# Patient Record
Sex: Female | Born: 1950 | Race: White | Hispanic: No | Marital: Married | Smoking: Never smoker
Health system: Southern US, Community
[De-identification: ages and names within clinical notes are randomized; demographics above are authoritative.]

## PROBLEM LIST (undated history)

## (undated) DIAGNOSIS — I209 Angina pectoris, unspecified: Secondary | ICD-10-CM

## (undated) DIAGNOSIS — R51 Headache: Secondary | ICD-10-CM

## (undated) DIAGNOSIS — I499 Cardiac arrhythmia, unspecified: Secondary | ICD-10-CM

## (undated) DIAGNOSIS — M797 Fibromyalgia: Secondary | ICD-10-CM

## (undated) DIAGNOSIS — I739 Peripheral vascular disease, unspecified: Secondary | ICD-10-CM

## (undated) DIAGNOSIS — D649 Anemia, unspecified: Secondary | ICD-10-CM

## (undated) DIAGNOSIS — M1990 Unspecified osteoarthritis, unspecified site: Secondary | ICD-10-CM

## (undated) DIAGNOSIS — F32A Depression, unspecified: Secondary | ICD-10-CM

## (undated) DIAGNOSIS — I10 Essential (primary) hypertension: Secondary | ICD-10-CM

## (undated) DIAGNOSIS — F329 Major depressive disorder, single episode, unspecified: Secondary | ICD-10-CM

## (undated) DIAGNOSIS — F419 Anxiety disorder, unspecified: Secondary | ICD-10-CM

## (undated) HISTORY — PX: JOINT REPLACEMENT: SHX530

## (undated) HISTORY — PX: VEIN LIGATION AND STRIPPING: SHX2653

## (undated) HISTORY — PX: APPENDECTOMY: SHX54

## (undated) HISTORY — PX: CARPAL TUNNEL RELEASE: SHX101

---

## 1997-09-21 ENCOUNTER — Ambulatory Visit (HOSPITAL_BASED_OUTPATIENT_CLINIC_OR_DEPARTMENT_OTHER): Admission: RE | Admit: 1997-09-21 | Discharge: 1997-09-21 | Payer: Self-pay | Admitting: Orthopedic Surgery

## 1998-03-02 ENCOUNTER — Other Ambulatory Visit: Admission: RE | Admit: 1998-03-02 | Discharge: 1998-03-02 | Payer: Self-pay | Admitting: Obstetrics and Gynecology

## 1999-04-11 ENCOUNTER — Other Ambulatory Visit: Admission: RE | Admit: 1999-04-11 | Discharge: 1999-04-11 | Payer: Self-pay | Admitting: Obstetrics and Gynecology

## 2000-07-06 ENCOUNTER — Other Ambulatory Visit: Admission: RE | Admit: 2000-07-06 | Discharge: 2000-07-06 | Payer: Self-pay | Admitting: Obstetrics and Gynecology

## 2001-08-16 ENCOUNTER — Other Ambulatory Visit: Admission: RE | Admit: 2001-08-16 | Discharge: 2001-08-16 | Payer: Self-pay | Admitting: Obstetrics and Gynecology

## 2002-12-05 ENCOUNTER — Other Ambulatory Visit: Admission: RE | Admit: 2002-12-05 | Discharge: 2002-12-05 | Payer: Self-pay | Admitting: Obstetrics and Gynecology

## 2004-01-08 ENCOUNTER — Other Ambulatory Visit: Admission: RE | Admit: 2004-01-08 | Discharge: 2004-01-08 | Payer: Self-pay | Admitting: Obstetrics and Gynecology

## 2004-07-01 ENCOUNTER — Ambulatory Visit (HOSPITAL_COMMUNITY): Admission: RE | Admit: 2004-07-01 | Discharge: 2004-07-01 | Payer: Self-pay | Admitting: Gastroenterology

## 2005-02-05 ENCOUNTER — Other Ambulatory Visit: Admission: RE | Admit: 2005-02-05 | Discharge: 2005-02-05 | Payer: Self-pay | Admitting: Obstetrics and Gynecology

## 2008-12-06 ENCOUNTER — Inpatient Hospital Stay (HOSPITAL_COMMUNITY): Admission: RE | Admit: 2008-12-06 | Discharge: 2008-12-09 | Payer: Self-pay | Admitting: Orthopedic Surgery

## 2009-03-05 ENCOUNTER — Inpatient Hospital Stay (HOSPITAL_COMMUNITY): Admission: RE | Admit: 2009-03-05 | Discharge: 2009-03-08 | Payer: Self-pay | Admitting: Orthopedic Surgery

## 2010-08-15 LAB — URINALYSIS, ROUTINE W REFLEX MICROSCOPIC
Glucose, UA: NEGATIVE mg/dL
Hgb urine dipstick: NEGATIVE
Protein, ur: NEGATIVE mg/dL
Specific Gravity, Urine: 1.023 (ref 1.005–1.030)
Urobilinogen, UA: 0.2 mg/dL (ref 0.0–1.0)

## 2010-08-15 LAB — CBC
HCT: 33.5 % — ABNORMAL LOW (ref 36.0–46.0)
HCT: 35.5 % — ABNORMAL LOW (ref 36.0–46.0)
Hemoglobin: 11.4 g/dL — ABNORMAL LOW (ref 12.0–15.0)
MCHC: 33.6 g/dL (ref 30.0–36.0)
MCHC: 34 g/dL (ref 30.0–36.0)
MCHC: 34.2 g/dL (ref 30.0–36.0)
MCHC: 34.5 g/dL (ref 30.0–36.0)
MCV: 86.8 fL (ref 78.0–100.0)
MCV: 86.9 fL (ref 78.0–100.0)
MCV: 87.1 fL (ref 78.0–100.0)
Platelets: 175 10*3/uL (ref 150–400)
Platelets: 204 10*3/uL (ref 150–400)
Platelets: 245 10*3/uL (ref 150–400)
RBC: 4.04 MIL/uL (ref 3.87–5.11)
RDW: 13.7 % (ref 11.5–15.5)
RDW: 13.8 % (ref 11.5–15.5)
WBC: 9.9 10*3/uL (ref 4.0–10.5)

## 2010-08-15 LAB — TYPE AND SCREEN
ABO/RH(D): A POS
Antibody Screen: NEGATIVE

## 2010-08-15 LAB — BASIC METABOLIC PANEL
BUN: 10 mg/dL (ref 6–23)
BUN: 7 mg/dL (ref 6–23)
CO2: 29 mEq/L (ref 19–32)
CO2: 29 mEq/L (ref 19–32)
Calcium: 8.6 mg/dL (ref 8.4–10.5)
Creatinine, Ser: 0.92 mg/dL (ref 0.4–1.2)
Creatinine, Ser: 0.99 mg/dL (ref 0.4–1.2)
GFR calc Af Amer: >60 mL/min (ref >60)
GFR calc Af Amer: >60 mL/min (ref >60)
GFR calc non Af Amer: 57 mL/min — ABNORMAL LOW (ref >60)
GFR calc non Af Amer: 59 mL/min — ABNORMAL LOW (ref >60)
GFR calc non Af Amer: >60 mL/min (ref >60)
Glucose, Bld: 147 mg/dL — ABNORMAL HIGH (ref 70–99)
Potassium: 3.1 mEq/L — ABNORMAL LOW (ref 3.5–5.1)
Potassium: 4.1 mEq/L (ref 3.5–5.1)
Sodium: 138 mEq/L (ref 135–145)

## 2010-08-15 LAB — URINE MICROSCOPIC-ADD ON

## 2010-08-15 LAB — APTT: aPTT: 26 seconds (ref 24–37)

## 2010-08-15 LAB — COMPREHENSIVE METABOLIC PANEL
ALT: 16 U/L (ref 0–35)
AST: 22 U/L (ref 0–37)
Albumin: 4.4 g/dL (ref 3.5–5.2)
Calcium: 9.7 mg/dL (ref 8.4–10.5)
Creatinine, Ser: 0.97 mg/dL (ref 0.4–1.2)
GFR calc Af Amer: >60 mL/min (ref >60)
Sodium: 139 mEq/L (ref 135–145)
Total Protein: 7.9 g/dL (ref 6.0–8.3)

## 2010-08-15 LAB — PROTIME-INR
INR: 1.07 (ref 0.00–1.49)
Prothrombin Time: 13.8 seconds (ref 11.6–15.2)
Prothrombin Time: 18.3 seconds — ABNORMAL HIGH (ref 11.6–15.2)

## 2010-08-18 LAB — PROTIME-INR
INR: 1 (ref 0.00–1.49)
INR: 1.2 (ref 0.00–1.49)

## 2010-08-18 LAB — BASIC METABOLIC PANEL
BUN: 14 mg/dL (ref 6–23)
BUN: 20 mg/dL (ref 6–23)
CO2: 29 mEq/L (ref 19–32)
CO2: 30 mEq/L (ref 19–32)
Calcium: 8.6 mg/dL (ref 8.4–10.5)
Calcium: 8.6 mg/dL (ref 8.4–10.5)
Chloride: 106 mEq/L (ref 96–112)
Chloride: 107 mEq/L (ref 96–112)
Creatinine, Ser: 0.91 mg/dL (ref 0.4–1.2)
Creatinine, Ser: 0.98 mg/dL (ref 0.4–1.2)
GFR calc Af Amer: >60 mL/min (ref >60)
GFR calc non Af Amer: 56 mL/min — ABNORMAL LOW (ref >60)
Glucose, Bld: 107 mg/dL — ABNORMAL HIGH (ref 70–99)
Glucose, Bld: 122 mg/dL — ABNORMAL HIGH (ref 70–99)
Glucose, Bld: 124 mg/dL — ABNORMAL HIGH (ref 70–99)
Potassium: 3.6 mEq/L (ref 3.5–5.1)
Potassium: 3.9 mEq/L (ref 3.5–5.1)
Sodium: 138 mEq/L (ref 135–145)

## 2010-08-18 LAB — URINALYSIS, ROUTINE W REFLEX MICROSCOPIC
Bilirubin Urine: NEGATIVE
Glucose, UA: NEGATIVE mg/dL
Hgb urine dipstick: NEGATIVE
Ketones, ur: NEGATIVE mg/dL
Protein, ur: NEGATIVE mg/dL
Urobilinogen, UA: 0.2 mg/dL (ref 0.0–1.0)

## 2010-08-18 LAB — CBC
HCT: 31 % — ABNORMAL LOW (ref 36.0–46.0)
HCT: 31.9 % — ABNORMAL LOW (ref 36.0–46.0)
HCT: 33.8 % — ABNORMAL LOW (ref 36.0–46.0)
HCT: 42.7 % (ref 36.0–46.0)
Hemoglobin: 10.6 g/dL — ABNORMAL LOW (ref 12.0–15.0)
Hemoglobin: 10.9 g/dL — ABNORMAL LOW (ref 12.0–15.0)
MCHC: 34.4 g/dL (ref 30.0–36.0)
MCV: 88.2 fL (ref 78.0–100.0)
MCV: 88.2 fL (ref 78.0–100.0)
MCV: 88.4 fL (ref 78.0–100.0)
Platelets: 171 10*3/uL (ref 150–400)
Platelets: 230 10*3/uL (ref 150–400)
RBC: 3.48 MIL/uL — ABNORMAL LOW (ref 3.87–5.11)
RBC: 3.82 MIL/uL — ABNORMAL LOW (ref 3.87–5.11)
RDW: 12.3 % (ref 11.5–15.5)
WBC: 6.9 10*3/uL (ref 4.0–10.5)
WBC: 8.6 10*3/uL (ref 4.0–10.5)
WBC: 8.6 10*3/uL (ref 4.0–10.5)

## 2010-08-18 LAB — COMPREHENSIVE METABOLIC PANEL
AST: 20 U/L (ref 0–37)
Albumin: 4.1 g/dL (ref 3.5–5.2)
BUN: 22 mg/dL (ref 6–23)
Calcium: 9.5 mg/dL (ref 8.4–10.5)
Chloride: 103 mEq/L (ref 96–112)
Creatinine, Ser: 0.96 mg/dL (ref 0.4–1.2)
GFR calc Af Amer: >60 mL/min (ref >60)
GFR calc non Af Amer: 59 mL/min — ABNORMAL LOW (ref >60)
Total Bilirubin: 0.9 mg/dL (ref 0.3–1.2)

## 2010-08-18 LAB — APTT: aPTT: 26 seconds (ref 24–37)

## 2010-08-18 LAB — TYPE AND SCREEN: ABO/RH(D): A POS

## 2010-09-24 NOTE — Op Note (Signed)
[name], [name] NO.:  [id number]   MEDICAL RECORD NO.:  [id number]          PATIENT TYPE:  INP   LOCATION:  0006                         FACILITY:  [facility]   PHYSICIAN:  Ollen Gross, M.D.    DATE OF BIRTH:  06-Jul-1950   DATE OF PROCEDURE:  12/06/2008  DATE OF DISCHARGE:                               OPERATIVE REPORT   PREOPERATIVE DIAGNOSIS:  Osteoarthritis right knee.   POSTOPERATIVE DIAGNOSIS:  Osteoarthritis right knee.   PROCEDURE:  Right total knee arthroplasty.   SURGEON:  Ollen Gross, M.D.   ASSISTANT:  Avel Peace PA-C   ANESTHESIA:  Spinal.   ESTIMATED BLOOD LOSS:  Minimal.   DRAINS:  None.   TOURNIQUET TIME:  32 minutes at 300 mmHg.   COMPLICATIONS:  None.   CONDITION:  Stable to recovery room.   BRIEF CLINICAL NOTE:  Ms. Ashley Hoover is a 60 year old female with end-stage  arthritis of the right knee with progressively worsening pain and  dysfunction.  She has failed nonoperative management and presents now  for right total knee arthroplasty.   PROCEDURE IN DETAIL:  After successful administration of spinal  anesthetic, a tourniquet placed on the right thigh and right lower  extremity prepped and draped in the usual sterile fashion.  Extremity  was wrapped in Esmarch, knee flexed, tourniquet inflated to 300 mmHg.  A  midline incision was made with a 10 blade through subcutaneous tissue to  the level of the extensor mechanism.  A fresh blade was used make a  medial parapatellar arthrotomy.  Soft tissue on the proximal medial  tibia was subperiosteally elevated to the joint line with the knife and  into the semimembranosus bursa with a Cobb elevator.  Soft tissue  laterally was  elevated with attention being paid to avoiding the  patellar tendon on tibial tubercle.  The patella was subluxed laterally,  knee flexed 90 degrees and ACL and PCL removed.  Drill was used create a  starting hole in the distal femur.  The canal was thoroughly  irrigated.  The 5 degree right valgus alignment guide was placed and referencing off  the posterior condyles, rotation marked and the block pinned to remove  11 mm off the distal femur.  I took 11 because of preop flexion  contracture.  Distal femoral resection was made with an oscillating saw.  Sizing blocks placed and size 3 was most appropriate.  Rotation marked  off the epicondylar axis.  A size 3 cutting block was placed and the  anterior, posterior and chamfer cuts made.   The tibia was subluxed forward and the menisci removed.  Extramedullary  tibial alignment guides placed referencing proximally at the medial  aspect of the tibial tubercle and distally along the second metatarsal  axis and tibial crest.  The block was pinned to remove 2 mm off the more  deficient medial side.  Tibial resection is made with an oscillating  saw.  Size 2.5 is most appropriate tibial component and the proximal  tibia is prepared with modular drill and keel punch for the size 2.5.  Femoral preparation was  completed with the intercondylar cut for the  size 3.   Size 2.5 mobile bearing tibial trial and size 3 posterior stabilized  femoral trial and 10 mm posterior stabilized rotating platform insert  trial are placed.  With the 10, full extension was achieved with  excellent varus-valgus and anterior-posterior balance throughout full  range of motion.  Patella was then everted and thickness measured to 21  mm.  Freehand resection was taken to 12 mm, 35 template is placed, lug  holes were drilled, trial patella was placed and it tracked normally.  Osteophytes removed with the posterior femur with the trial in place.  All trials are removed and the cut bone surfaces are prepared with  pulsatile lavage.  Cement was mixed and once ready for implantation, a  size 2.5 mobile bearing tibial tray, size 3 posterior stabilized femur  and 35 patella are cemented into place and patella was held with a  clamp.   Trial 10-mm insert was placed, knee held in full extension and  all extruded cement removed.  Cement fully hardened then the permanent  10 mm posterior stabilized rotating platform insert was placed in the  tibial tray.  FloSeal was injected into medial lateral gutters and  suprapatellar area.  Moist sponge is placed and tourniquet released for  total time of 32 minutes.  Sponge was held 2 minutes then removed.  Minimal bleeding was encountered.  The bleeding that was encountered is  stopped with electrocautery.  The wound again was copiously irrigated to  remove the FloSeal.  The arthrotomy was then closed with interrupted #1  PDS.  Flexion against gravity is about 140 degrees.  Subcu was closed  with interrupted 2-0 Vicryl and subcuticular running 4-0 Monocryl.  Incision was cleaned and dried and Steri-Strips and a bulky sterile  dressing are applied.  She was then placed into a knee immobilizer,  awakened and transferred to recovery in stable condition.      Ollen Gross, M.D.  Electronically Signed     FA/MEDQ  D:  12/06/2008  T:  12/07/2008  Job:  [id number]

## 2010-09-24 NOTE — H&P (Signed)
[name], Hoover NO.:  [id number]   MEDICAL RECORD NO.:  [id number]          PATIENT TYPE:  INP   LOCATION:  1604                         FACILITY:  [facility]   PHYSICIAN:  Ollen Gross, M.D.    DATE OF BIRTH:  05/18/1950   DATE OF ADMISSION:  12/06/2008  DATE OF DISCHARGE:                              HISTORY & PHYSICAL   Date of office visit and history and physical November 16, 2008.   CHIEF COMPLAINT:  Right greater than left knee pain.   HISTORY OF PRESENT ILLNESS:  The patient is a 60 year old female who has  seen by Dr. Lequita Halt secondary to pain early last year for knee pain.  She has had known arthritis for quite some time now, it has  progressively gotten worse.  Unfortunately she has gone on to develop  bone-on-bone since last year.  She has end-stage arthritis, felt to be a  good candidate.  Risks and benefits have been discussed.  She elects to  proceed with surgery.   ALLERGIES:  PENICILLIN IN Ashley Hoover CHILDHOOD YEARS CAUSE HIVES.   CURRENT MEDICATIONS:  Cymbalta, Arthrotec stopped preoperatively,  simvastatin, triamterene with HCTZ, calcium plus D, glucosamine,  multivitamin.  She is also taking MSM, aspirin, melatonin.   PAST MEDICAL HISTORY:  1. Hypertension.  2. Mild coronary arterial disease.  3. Hyperlipidemia.  4. Varicose veins.  5. Osteopenia.  6. Fibromyalgia.  7. Postmenopausal.  8. Childhood illnesses, measles, mumps.   PAST SURGICAL HISTORY:  1. Appendectomy.  2. Varicose vein stripping.  3. Tubal ligation.   FAMILY HISTORY:  Father Alzheimer's and pneumonia.  Mother hypertension  with history of blood clots and PE, possible stroke.  Brother with  asthma.   SOCIAL HISTORY:  Married, works as a Architect, nonsmoker,  occasional intake of alcohol and three children.  Husband and daughters  will be assisting with care after surgery.  She has four steps entering  Ashley Hoover home.   REVIEW OF SYSTEMS:  No fevers, chills, occasional  night sweats.  NEURO:  No seizures, syncope or paralysis.  RESPIRATORY:  Little bit of  shortness of breath on exertion but no shortness of breath at rest, no  productive cough or hemoptysis.  CARDIOVASCULAR:  No chest pain, angina,  orthopnea.  GI:  A little bit of constipation, no nausea, vomiting,  diarrhea.  GU:  No dysuria, hematuria or discharge.  MUSCULOSKELETAL:  Joint pain and swelling.   VITAL SIGNS:  Pulse 72, respirations 12, blood pressure 134/84.  GENERAL:  A 60 year old white female, well-nourished, well-developed, no  acute distress.  She is alert, oriented and cooperative, pleasant.  HEENT:  Normocephalic/atraumatic.  Pupils are round and reactive, EOMs  intact.  NECK:  Supple.  CHEST:  Clear anterior and posterior chest walls.  No rhonchi, rales or  wheezing.  HEART:  Regular rate and rhythm.  No murmur.  ABDOMEN:  Soft, nontender, bowel sounds present.  RECTAL, BREASTS, GENITALIA:  Not done, not pertinent to present illness.  EXTREMITIES:  Marked crepitus with the right knee, no instability, no  effusion.   IMPRESSION:  Osteoarthritis of  the right knee.   PLAN:  The patient admitted to [facility], undergoing right  total knee replacement arthroplasty.  Surgery will be performed by Dr.  Ollen Gross.      Alexzandrew L. Perkins, P.A.C.      Ollen Gross, M.D.  Electronically Signed    ALP/MEDQ  D:  12/08/2008  T:  12/08/2008  Job:  [id number]   cc:   Ollen Gross, M.D.  Fax: [phone]   Marjory Lies, M.D.  Fax: [phone]   Areatha Keas, M.D.  Fax: [phone]   Juluis Mire, M.D.  Fax: [phone]

## 2010-09-27 NOTE — Op Note (Signed)
NAMECARMELINA, Hoover NO.:  [id number]   MEDICAL RECORD NO.:  [id number]          PATIENT TYPE:  AMB   LOCATION:  ENDO                         FACILITY:  [facility]   PHYSICIAN:  Bernette Redbird, M.D.   DATE OF BIRTH:  05-06-1951   DATE OF PROCEDURE:  07/01/2004  DATE OF DISCHARGE:                                 OPERATIVE REPORT   PROCEDURE:  Colonoscopy.   INDICATIONS:  Screening for colon cancer. No worrisome risk factors or  symptoms. No prior exams.   FINDINGS:  Normal exam.   DESCRIPTION OF PROCEDURE:  The nature, purpose and risks of the procedure  had been discussed with the patient who provided written consent. Sedation  was fentanyl 75 mcg and Versed 6 mg IV without arrhythmias or desaturation.  The Olympus adjustable tension pediatric video colonoscope was advanced  fairly easily to the cecum as identified by clear visualization of the  appendiceal orifice and the absence of further lumen. We did use some  external abdominal compression to help control looping.   There was a little bit of stool film in the cecum and proximal ascending  colon which is not felt likely to have obscured any significant lesions.  Otherwise, the prep was excellent so it is felt that all areas were  adequately seen.   This was a normal examination. No polyps, cancer, colitis, vascular  malformations or diverticular disease were noted and retroflexion of the  rectum and reinspection of the rectum were normal. No biopsies were  obtained. The patient tolerated the procedure well and there were no  apparent complications.   IMPRESSION:  Normal screening colonoscopy in a standard risk individual  (V76.051).   PLAN:  Flexible sigmoidoscopy in 5 years.      RB/MEDQ  D:  07/01/2004  T:  07/01/2004  Job:  [id number]   cc:   Juluis Mire, M.D.  [address] [place]  [place] [address]  Fax: [phone]

## 2010-09-27 NOTE — Discharge Summary (Signed)
Ashley Hoover, Ashley Hoover NO.:  [id number]   MEDICAL RECORD NO.:  [id number]          PATIENT TYPE:  INP   LOCATION:  1604                         FACILITY:  [facility]   PHYSICIAN:  Ashley Hoover, M.D.    DATE OF BIRTH:  07-29-1950   DATE OF ADMISSION:  12/06/2008  DATE OF DISCHARGE:  12/09/2008                               DISCHARGE SUMMARY   ADMITTING DIAGNOSES:  1. Osteoarthritis, right knee.  2. Hypertension.  3. Mild coronary arterial disease.  4. Hyperlipidemia.  5. Varicose veins.  6. Osteopenia.  7. Fibromyalgia.  8. Postmenopausal.  9. Childhood illnesses of measles and mumps.   DISCHARGE DIAGNOSES:  1. Osteoarthritis, right knee, status post right total knee      replacement arthroplasty.  2. Hypertension.  3. Mild coronary arterial disease.  4. Hyperlipidemia.  5. Varicose veins.  6. Osteopenia.  7. Fibromyalgia.  8. Postmenopausal.  9. Childhood illnesses of measles and mumps.   PROCEDURE:  December 06, 2008, right total knee.  Surgeon, Dr. Lequita Hoover.  Assistant, Ashley Hoover, P.A.-C.  Anesthesia was spinal with Duramorph.  Tourniquet time 32 minutes.   CONSULTS:  None.   BRIEF HISTORY:  Ashley Hoover is a 60 year old female with end-stage  arthritis of the right knee, progressively worsening pain and  dysfunction, failed nonoperative management, now presents for a total  knee arthroplasty.   LABORATORY DATA:  Preoperative CBC showed hemoglobin of 14.7, hematocrit  of 42.7, white cell count 6.9, platelets 230.  Postoperative hemoglobin  11.6 and 10.6.  Last noted hemoglobin and hematocrit 10.9 with a  hematocrit of 31.9.  PT/INR on admission 12.8 and 26 respectively.  INR  1.0.  Serial pro times followed per Coumadin protocol.  Last noted  PT/INR 16.7 and 1.3.  Chemistry panel on admission, low potassium on  admission of 2.9, potassium came back up to 3.9, remained normal  throughout the hospital course, remaining electrolytes remained within  normal limits.  Preoperative UA was negative.  Blood group type A+.  She  had EKG on her chart dated it looks like November 01, 2008, sinus rhythm,  possible septal infarct, age indeterminate, inferior and lateral ST and  T changes, unconfirmed EKG.  Echocardiogram report dated November 15, 2008,  echocardiogram report showed complete two-dimensional transthoracic  echocardiogram, left ventricular systolic function normal, ejection  fracture approximately 55%, mild mitral regurgitation, mild tricuspid  regurgitation, right ventricular systolic pressure normal, aortic valve  appears to be mildly sclerotic.  Doppler flow pattern suggestive of  impaired LV relaxation.  Left atrial size is normal.   HOSPITAL COURSE:  Patient admitted to [facility], taken to the  OR, underwent above-stated procedure without complication.  Patient  tolerated procedure well.  Later transferred to recovery room,  orthopedic floor.  Started on PCA and p.o. analgesic pain control  following surgery.  Doing pretty well with the exception of spasms.  Encouraged muscle relaxants.  Hemoglobin was stable.  Electrolytes  looked good.  Started getting up out of bed on day 1 with therapy and  actually did very well walking about 90 feet.  By  day 2, already walking  in the hallways.  Vitals looked good.  Dressing changed.  Incision  looked good.  Labs continued to remain stable.  Continued to progress  well and by day 3 she was meeting her goals, tolerating her medications,  had been weaned over to p.o. medications, and was discharged home.   DISCHARGE PLAN:  1. Patient discharged home on December 09, 2008.  2. Discharge diagnoses:  Please see above.  3. Discharge medications:  Percocet, Robaxin, Coumadin, and Lovenox      prior to discharge with 2 more doses at home.   FOLLOWUP:  Two weeks, call office for an appointment.   ACTIVITY:  Total knee protocol.  Home health PT, home health nursing.  Weightbearing as  tolerated.   DISPOSITION:  Home.   CONDITION UPON DISCHARGE:  Improving.      Ashley Hoover, P.A.C.      Ashley Hoover, M.D.  Electronically Signed    ALP/MEDQ  D:  01/11/2009  T:  01/11/2009  Job:  [id number]   cc:   Ashley Hoover, M.D.  Fax: [phone]   Ashley Hoover, M.D.  Fax: [phone]   Ashley Hoover, M.D.  Fax: [phone]   Ashley Hoover, M.D.  Fax: [phone]

## 2011-09-12 ENCOUNTER — Other Ambulatory Visit: Payer: Self-pay | Admitting: Orthopedic Surgery

## 2011-09-12 MED ORDER — BUPIVACAINE LIPOSOME 1.3 % IJ SUSP: 20.0000 mL | Freq: Once | INTRAMUSCULAR | Status: DC

## 2011-09-12 MED ORDER — DEXAMETHASONE SODIUM PHOSPHATE 10 MG/ML IJ SOLN: 10.0000 mg | Freq: Once | INTRAMUSCULAR | Status: DC

## 2011-11-18 ENCOUNTER — Encounter (HOSPITAL_COMMUNITY): Payer: Self-pay | Admitting: Pharmacy Technician

## 2011-11-19 NOTE — H&P (Signed)
Ashley Hoover DOB: 04-11-1951   Chief Complaint: right hip pain   History of Present Illness The patient is a 61 year old female who comes in today for a preoperative History and Physical. The patient is scheduled for a right total hip arthroplasty to be performed by Dr. Gus Rankin. Aluisio, MD at [facility] on Friday November 28, 2011 . Ashley Hoover with her main problem right now being her hip pain. This is occurring in the groin and radiating down the thigh to her knee. This is limiting what she can and can not do. This is becoming progressively worse. It does keep her up at night. Medications are not helping. This is definitely limiting her activities. With regards to the knees, there is absolutely no pain along the left knee. Since the hip started hurting, she has had a little bit of soreness in the right knee. The most predictable means for increased function and decreased pain in the right hip is a right total hip arthroplasty.     Problem List/Past Medical S/P total knee arthroplasty (V43.65) Osteoarthritis, Hip (715.35) Fibromyalgia Depression Hypercholesterolemia Hypertension Chronic Pain Anxiety Disorder Impaired Hearing Varicose Veins Heart Stenting Hemorrhoids Iron Deficiency Anemia Measles Mumps Degenerative Disc Disease Menopause   Allergies Penicillins   Family History Congestive Heart Failure. grandmother fathers side Cerebrovascular Accident. mother Depression. mother and child Severe allergy. brother and child Hypertension. mother Father deceased age 19 due to complication from pneumonia Mother deceased age 53 due to stroke   Social History Illicit drug use. no Exercise. Exercises weekly; does running / walking and gym / weights Living situation. 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 married Alcohol use. current drinker; drinks wine; only  occasionally per week Children. 3 Drug/Alcohol Rehab (Currently). 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 4-5 Previously in rehab. no Tobacco use. never smoker Tobacco / smoke exposure. no Post Surgical Plans- home with husband Advance directives- Healthcare POA  Medication History Triamterene-HCTZ (75-50MG  Tablet, Oral) Active. Simvastatin ( Oral) - Active. Cymbalta ( Oral) - Active. Arthrotec ( Oral)  - Active. Multivitamin Ferrous Sulfate Aspirin 81mg  Melatonin Ambien Calcium    Past Surgical History Tubal Ligation Total Knee Replacement. bilateral 2010 Leg Circulation Surgery. bilateral Carpal Tunnel Repair. right Appendectomy 1960 Heart Stents Dilation and Curettage of Uterus    Review of Systems General:Present- Fatigue. Not Present- Chills, Fever, Night Sweats, Weight Gain, Weight Loss and Memory Loss. Skin:Not Present- Hives, Itching, Rash, Eczema and Lesions. HEENT:Not Present- Tinnitus, Headache, Double Vision, Visual Loss, Hearing Loss and Dentures. Respiratory:Present: Allergies. Not Present- Shortness of breath with exertion, Shortness of breath at rest, Coughing up blood and Chronic Cough. Cardiovascular:Present: Ankle Swelling. Not Present- Chest Pain, Racing/skipping heartbeats, Difficulty Breathing Lying Down, Murmur, and Palpitations. Gastrointestinal:Present: Constipation. Not Present- Bloody Stool, Heartburn, Abdominal Pain, Vomiting, Nausea, Diarrhea, Difficulty Swallowing, Jaundice and Loss of appetitie. Female Genitourinary:Not Present- Blood in Urine, Urinary frequency, Weak urinary stream, Discharge, Flank Pain, Incontinence, Painful Urination, Urgency, Urinary Retention and Urinating at Night. Musculoskeletal:Present- Muscle Weakness, Joint Pain and Morning Stiffness, Muscle Pain, Joint Swelling, and Back Pain. Not Present- Spasms. Neurological:Not Present- Tremor,  Dizziness, Blackout spells, Paralysis, Difficulty with balance and Weakness. Psychiatric:Present- Insomnia.   Vitals Weight: 175 lb Height: 65 in Body Surface Area: 1.91 m Body Mass Index: 29.12 kg/m Pulse: 84 (Regular) Resp.: 16 (Unlabored) BP: 130/84 (Sitting, Left Arm, Standard)    Physical Exam  General Mental Status - Alert, cooperative and good historian. General Appearance- pleasant. Not in acute distress. Orientation- Oriented X3. Build & Nutrition- Well nourished and Well developed. Head and Neck Head- normocephalic, atraumatic . Neck Global Assessment- supple. no bruit auscultated on the right and no bruit auscultated on the left. Eye Pupil- Bilateral- Regular and Round. Motion- Bilateral- EOMI. Chest and Lung Exam Auscultation: Breath sounds:- clear at anterior chest wall and - clear at posterior chest wall. Adventitious sounds:- No Adventitious sounds. Cardiovascular Auscultation:Rhythm- Regular rate and rhythm. Heart Sounds- S1 WNL and S2 WNL. Murmurs & Other Heart Sounds:Auscultation of the heart reveals - No Murmurs. Abdomen Palpation/Percussion:Tenderness- Abdomen is non-tender to palpation. Rigidity (guarding)- Abdomen is soft. Auscultation:Auscultation of the abdomen reveals - Bowel sounds normal. Female Genitourinary Not done, not pertinent to present illness Peripheral Vascular Upper Extremity: Palpation:- Pulses bilaterally normal. Lower Extremity: Palpation:- Pulses bilaterally normal Neurologic Examination of related systems reveals - normal muscle strength and tone in all extremities. Neurologic evaluation reveals - normal sensation and upper and lower extremity deep tendon reflexes intact bilaterally . Musculoskeletal The knees look great. There is a range of about 0-125 on each side with no swelling, tenderness or instability. The left hip has a normal range of motion. The right hip has flexion to  90, no internal rotation, about 10 external rotation and 20 abduction. Gait pattern is antalgic on the right.   RADIOGRAPHS: AP and lateral of both knees show both prosthesis to be in excellent position. There is no periprosthetic abnormalities. AP of the pelvis and lateral of the right hip show severe bone on bone arthritis of the hip with a large osteophyte formation and subchondral cystic formation.   Assessment & Plan Osteoarthritis, Hip (715.35) Right total hip arthroplasty    Dimitri Ped, PA-C

## 2011-11-24 ENCOUNTER — Ambulatory Visit (HOSPITAL_COMMUNITY)
Admission: RE | Admit: 2011-11-24 | Discharge: 2011-11-24 | Disposition: A | Discharge status: Home / Self Care | Payer: BC Managed Care – PPO | Source: Ambulatory Visit | Attending: Orthopedic Surgery | Admitting: Orthopedic Surgery

## 2011-11-24 ENCOUNTER — Encounter (HOSPITAL_COMMUNITY): Payer: Self-pay

## 2011-11-24 ENCOUNTER — Encounter (HOSPITAL_COMMUNITY)
Admission: RE | Admit: 2011-11-24 | Discharge: 2011-11-24 | Disposition: A | Discharge status: Home / Self Care | Payer: BC Managed Care – PPO | Source: Ambulatory Visit | Attending: Orthopedic Surgery | Admitting: Orthopedic Surgery

## 2011-11-24 ENCOUNTER — Other Ambulatory Visit: Payer: Self-pay

## 2011-11-24 DIAGNOSIS — Z01812 Encounter for preprocedural laboratory examination: Secondary | ICD-10-CM | POA: Insufficient documentation

## 2011-11-24 DIAGNOSIS — Z01818 Encounter for other preprocedural examination: Secondary | ICD-10-CM | POA: Insufficient documentation

## 2011-11-24 HISTORY — DX: Angina pectoris, unspecified: I20.9

## 2011-11-24 HISTORY — DX: Anxiety disorder, unspecified: F41.9

## 2011-11-24 HISTORY — DX: Cardiac arrhythmia, unspecified: I49.9

## 2011-11-24 HISTORY — DX: Depression, unspecified: F32.A

## 2011-11-24 HISTORY — DX: Major depressive disorder, single episode, unspecified: F32.9

## 2011-11-24 HISTORY — DX: Peripheral vascular disease, unspecified: I73.9

## 2011-11-24 HISTORY — DX: Headache: R51

## 2011-11-24 HISTORY — DX: Fibromyalgia: M79.7

## 2011-11-24 HISTORY — DX: Unspecified osteoarthritis, unspecified site: M19.90

## 2011-11-24 HISTORY — DX: Anemia, unspecified: D64.9

## 2011-11-24 HISTORY — DX: Essential (primary) hypertension: I10

## 2011-11-24 LAB — CBC
MCH: 29.7 pg (ref 26.0–34.0)
MCHC: 33.9 g/dL (ref 30.0–36.0)
MCV: 87.6 fL (ref 78.0–100.0)
Platelets: 220 10*3/uL (ref 150–400)
RDW: 13.4 % (ref 11.5–15.5)

## 2011-11-24 LAB — URINALYSIS, ROUTINE W REFLEX MICROSCOPIC
Glucose, UA: NEGATIVE mg/dL
Ketones, ur: NEGATIVE mg/dL
Leukocytes, UA: NEGATIVE
Nitrite: NEGATIVE
pH: 7 (ref 5.0–8.0)

## 2011-11-24 LAB — URINE MICROSCOPIC-ADD ON

## 2011-11-24 LAB — COMPREHENSIVE METABOLIC PANEL
ALT: 21 U/L (ref 0–35)
AST: 24 U/L (ref 0–37)
Albumin: 4.3 g/dL (ref 3.5–5.2)
Calcium: 9.7 mg/dL (ref 8.4–10.5)
Creatinine, Ser: 1.02 mg/dL (ref 0.50–1.10)
GFR calc non Af Amer: 57 mL/min — ABNORMAL LOW (ref >90)
Sodium: 141 mEq/L (ref 135–145)
Total Protein: 7.6 g/dL (ref 6.0–8.3)

## 2011-11-24 LAB — SURGICAL PCR SCREEN
MRSA, PCR: NEGATIVE
Staphylococcus aureus: POSITIVE — AB

## 2011-11-24 LAB — PROTIME-INR: INR: 0.99 (ref 0.00–1.49)

## 2011-11-24 NOTE — Progress Notes (Signed)
11/24/11 Urinalysis with micro result- Dr Lequita Halt made aware of by fax and confirmation received.

## 2011-11-24 NOTE — Patient Instructions (Signed)
20 Ashley Hoover  11/24/2011   Your procedure is scheduled on:  11/28/11 415pm-600pm  Report to [facility] at 145pm  Call this number if you have problems the morning of surgery: [phone]   Remember:   Do not eat food:After Midnight.  May have clear liquids:until Midnight  until 0930am then npo   Clear liquids include soda, tea, black coffee, apple or grape juice, broth.  Take these medicines the morning of surgery with A SIP OF WATER:    Do not wear jewelry, make-up or nail polish.  Do not wear lotions, powders, or perfumes.   Do not shave 48 hours prior to surgery.   Do not bring valuables to the hospital.  Contacts, dentures or bridgework may not be worn into surgery.  Leave suitcase in the car. After surgery it may be brought to your room.  For patients admitted to the hospital, checkout time is 11:00 AM the day of discharge.   Marland Kitchen   Special Instructions: CHG Shower Use Special Wash: 1/2 bottle night before surgery and 1/2 bottle morning of surgery. shower chin to toes with CHG.  Wash face and private parts with regular soap.     Please read over the following fact sheets that you were given: MRSA Information, coughing and deep breathing exercises, Blood Transfusion Fact sheet, leg exercises

## 2011-11-25 NOTE — Progress Notes (Signed)
11/25/11 No action needed per fax on preop abnormal urine and micro results done 11/24/11.

## 2011-11-25 NOTE — Progress Notes (Signed)
11/25/11 Called and requested most recent ekg from PCP.  2010 EKG placed on chart.

## 2011-11-26 NOTE — Progress Notes (Signed)
11/26/11 Patient returned call and stated she had received message regarding to obtain Mupirocin ointment.  Patient voiced understanding.

## 2011-11-26 NOTE — Progress Notes (Signed)
11/26/11 Fax and confirmation received to Dr Lequita Halt regarding patient's allergy to penicillins- causes hives .  Ancef has been ordered preop.

## 2011-11-28 ENCOUNTER — Encounter (HOSPITAL_COMMUNITY)
Admission: RE | Disposition: A | Discharge status: Home Health | Payer: Self-pay | Source: Ambulatory Visit | Attending: Orthopedic Surgery

## 2011-11-28 ENCOUNTER — Encounter (HOSPITAL_COMMUNITY): Payer: Self-pay | Admitting: Anesthesiology

## 2011-11-28 ENCOUNTER — Ambulatory Visit (HOSPITAL_COMMUNITY): Payer: BC Managed Care – PPO | Admitting: Anesthesiology

## 2011-11-28 ENCOUNTER — Inpatient Hospital Stay (HOSPITAL_COMMUNITY)
Admission: RE | Admit: 2011-11-28 | Discharge: 2011-12-01 | DRG: 818 | Disposition: A | Discharge status: Home Health | Payer: BC Managed Care – PPO | Source: Ambulatory Visit | Attending: Orthopedic Surgery | Admitting: Orthopedic Surgery

## 2011-11-28 ENCOUNTER — Encounter (HOSPITAL_COMMUNITY): Payer: Self-pay | Admitting: Orthopedic Surgery

## 2011-11-28 ENCOUNTER — Ambulatory Visit (HOSPITAL_COMMUNITY): Payer: BC Managed Care – PPO

## 2011-11-28 ENCOUNTER — Encounter (HOSPITAL_COMMUNITY): Payer: Self-pay

## 2011-11-28 DIAGNOSIS — M1610 Unilateral primary osteoarthritis, unspecified hip: Principal | ICD-10-CM | POA: Diagnosis present

## 2011-11-28 DIAGNOSIS — F329 Major depressive disorder, single episode, unspecified: Secondary | ICD-10-CM | POA: Diagnosis present

## 2011-11-28 DIAGNOSIS — M169 Osteoarthritis of hip, unspecified: Secondary | ICD-10-CM | POA: Diagnosis present

## 2011-11-28 DIAGNOSIS — IMO0001 Reserved for inherently not codable concepts without codable children: Secondary | ICD-10-CM | POA: Diagnosis present

## 2011-11-28 DIAGNOSIS — Z96649 Presence of unspecified artificial hip joint: Secondary | ICD-10-CM

## 2011-11-28 DIAGNOSIS — F411 Generalized anxiety disorder: Secondary | ICD-10-CM | POA: Diagnosis present

## 2011-11-28 DIAGNOSIS — Z96659 Presence of unspecified artificial knee joint: Secondary | ICD-10-CM

## 2011-11-28 DIAGNOSIS — I10 Essential (primary) hypertension: Secondary | ICD-10-CM | POA: Diagnosis present

## 2011-11-28 DIAGNOSIS — F3289 Other specified depressive episodes: Secondary | ICD-10-CM | POA: Diagnosis present

## 2011-11-28 HISTORY — PX: TOTAL HIP ARTHROPLASTY: SHX124

## 2011-11-28 LAB — TYPE AND SCREEN: Antibody Screen: NEGATIVE

## 2011-11-28 SURGERY — ARTHROPLASTY, HIP, TOTAL,POSTERIOR APPROACH
Anesthesia: Spinal | Site: Hip | Laterality: Right | Wound class: Clean

## 2011-11-28 MED ORDER — ACETAMINOPHEN 650 MG RE SUPP: 650.0000 mg | Freq: Four times a day (QID) | RECTAL | Status: DC | PRN

## 2011-11-28 MED ORDER — ONDANSETRON HCL 4 MG PO TABS: 4.0000 mg | ORAL_TABLET | Freq: Four times a day (QID) | ORAL | Status: DC | PRN

## 2011-11-28 MED ORDER — 0.9 % SODIUM CHLORIDE (POUR BTL) OPTIME
TOPICAL | Status: DC | PRN
  Administered 2011-11-28: 1000 mL

## 2011-11-28 MED ORDER — ONDANSETRON HCL 4 MG/2ML IJ SOLN: 4.0000 mg | Freq: Four times a day (QID) | INTRAMUSCULAR | Status: DC | PRN

## 2011-11-28 MED ORDER — DIPHENHYDRAMINE HCL 12.5 MG/5ML PO ELIX: 12.5000 mg | ORAL_SOLUTION | ORAL | Status: DC | PRN

## 2011-11-28 MED ORDER — METOCLOPRAMIDE HCL 5 MG/ML IJ SOLN: 5.0000 mg | Freq: Three times a day (TID) | INTRAMUSCULAR | Status: DC | PRN

## 2011-11-28 MED ORDER — DULOXETINE HCL 60 MG PO CPEP
60.0000 mg | ORAL_CAPSULE | Freq: Every day | ORAL | Status: DC
  Administered 2011-11-28 – 2011-11-30 (×3): 60 mg via ORAL
  Filled 2011-11-28 (×4): qty 1

## 2011-11-28 MED ORDER — METHOCARBAMOL 500 MG PO TABS
500.0000 mg | ORAL_TABLET | Freq: Four times a day (QID) | ORAL | Status: DC | PRN
  Administered 2011-11-29 – 2011-11-30 (×5): 500 mg via ORAL
  Filled 2011-11-28 (×5): qty 1

## 2011-11-28 MED ORDER — DOCUSATE SODIUM 100 MG PO CAPS
100.0000 mg | ORAL_CAPSULE | Freq: Two times a day (BID) | ORAL | Status: DC
  Administered 2011-11-28 – 2011-12-01 (×6): 100 mg via ORAL

## 2011-11-28 MED ORDER — FERROUS SULFATE 325 (65 FE) MG PO TABS
325.0000 mg | ORAL_TABLET | Freq: Every day | ORAL | Status: DC
  Administered 2011-11-29 – 2011-12-01 (×3): 325 mg via ORAL
  Filled 2011-11-28 (×4): qty 1

## 2011-11-28 MED ORDER — METOCLOPRAMIDE HCL 10 MG PO TABS: 5.0000 mg | ORAL_TABLET | Freq: Three times a day (TID) | ORAL | Status: DC | PRN

## 2011-11-28 MED ORDER — MIDAZOLAM HCL 5 MG/5ML IJ SOLN
INTRAMUSCULAR | Status: DC | PRN
  Administered 2011-11-28: 2 mg via INTRAVENOUS

## 2011-11-28 MED ORDER — BUPIVACAINE HCL (PF) 0.5 % IJ SOLN
INTRAMUSCULAR | Status: DC | PRN
  Administered 2011-11-28: 3 mL

## 2011-11-28 MED ORDER — PHENOL 1.4 % MT LIQD: 1.0000 | OROMUCOSAL | Status: DC | PRN

## 2011-11-28 MED ORDER — MEPERIDINE HCL 50 MG/ML IJ SOLN: 6.2500 mg | INTRAMUSCULAR | Status: DC | PRN

## 2011-11-28 MED ORDER — SODIUM CHLORIDE 0.9 % IV SOLN: INTRAVENOUS | Status: DC

## 2011-11-28 MED ORDER — CEFAZOLIN SODIUM-DEXTROSE 2-3 GM-% IV SOLR
INTRAVENOUS | Status: AC
  Filled 2011-11-28: qty 50

## 2011-11-28 MED ORDER — RIVAROXABAN 10 MG PO TABS
10.0000 mg | ORAL_TABLET | Freq: Every day | ORAL | Status: DC
  Administered 2011-11-29 – 2011-12-01 (×3): 10 mg via ORAL
  Filled 2011-11-28 (×4): qty 1

## 2011-11-28 MED ORDER — OXYCODONE HCL 5 MG PO TABS
5.0000 mg | ORAL_TABLET | ORAL | Status: DC | PRN
  Administered 2011-11-28: 5 mg via ORAL
  Administered 2011-11-29 – 2011-11-30 (×6): 10 mg via ORAL
  Filled 2011-11-28 (×4): qty 2
  Filled 2011-11-28: qty 1
  Filled 2011-11-28 (×2): qty 2

## 2011-11-28 MED ORDER — ZOLPIDEM TARTRATE 10 MG PO TABS: 10.0000 mg | ORAL_TABLET | Freq: Every evening | ORAL | Status: DC | PRN

## 2011-11-28 MED ORDER — EPHEDRINE SULFATE 50 MG/ML IJ SOLN
INTRAMUSCULAR | Status: DC | PRN
  Administered 2011-11-28: 5 mg via INTRAVENOUS
  Administered 2011-11-28 (×4): 10 mg via INTRAVENOUS

## 2011-11-28 MED ORDER — PROPOFOL 10 MG/ML IV EMUL
INTRAVENOUS | Status: DC | PRN
  Administered 2011-11-28: 120 ug/kg/min via INTRAVENOUS

## 2011-11-28 MED ORDER — CEFAZOLIN SODIUM 1-5 GM-% IV SOLN
1.0000 g | Freq: Four times a day (QID) | INTRAVENOUS | Status: AC
  Administered 2011-11-28 – 2011-11-29 (×2): 1 g via INTRAVENOUS
  Filled 2011-11-28 (×2): qty 50

## 2011-11-28 MED ORDER — ONDANSETRON HCL 4 MG/2ML IJ SOLN
INTRAMUSCULAR | Status: DC | PRN
  Administered 2011-11-28: 4 mg via INTRAVENOUS

## 2011-11-28 MED ORDER — SIMVASTATIN 40 MG PO TABS
40.0000 mg | ORAL_TABLET | Freq: Every evening | ORAL | Status: DC
  Administered 2011-11-29 – 2011-11-30 (×2): 40 mg via ORAL
  Filled 2011-11-28 (×4): qty 1

## 2011-11-28 MED ORDER — BUPIVACAINE HCL (PF) 0.5 % IJ SOLN
INTRAMUSCULAR | Status: AC
  Filled 2011-11-28: qty 30

## 2011-11-28 MED ORDER — MORPHINE SULFATE 2 MG/ML IJ SOLN
1.0000 mg | INTRAMUSCULAR | Status: DC | PRN
  Administered 2011-11-28: 1 mg via INTRAVENOUS
  Administered 2011-11-29: 2 mg via INTRAVENOUS
  Filled 2011-11-28 (×2): qty 1

## 2011-11-28 MED ORDER — BISACODYL 10 MG RE SUPP: 10.0000 mg | Freq: Every day | RECTAL | Status: DC | PRN

## 2011-11-28 MED ORDER — POLYETHYLENE GLYCOL 3350 17 G PO PACK
17.0000 g | PACK | Freq: Every day | ORAL | Status: DC
  Administered 2011-11-29 – 2011-11-30 (×2): 17 g via ORAL

## 2011-11-28 MED ORDER — CHLORHEXIDINE GLUCONATE 4 % EX LIQD
60.0000 mL | Freq: Once | CUTANEOUS | Status: DC
  Filled 2011-11-28: qty 60

## 2011-11-28 MED ORDER — TRIAMTERENE-HCTZ 75-50 MG PO TABS
1.0000 | ORAL_TABLET | Freq: Every day | ORAL | Status: DC
  Administered 2011-11-29 – 2011-12-01 (×3): 1 via ORAL
  Filled 2011-11-28 (×3): qty 1

## 2011-11-28 MED ORDER — CEFAZOLIN SODIUM-DEXTROSE 2-3 GM-% IV SOLR
2.0000 g | INTRAVENOUS | Status: AC
  Administered 2011-11-28: 2 g via INTRAVENOUS

## 2011-11-28 MED ORDER — ACETAMINOPHEN 10 MG/ML IV SOLN
INTRAVENOUS | Status: AC
  Filled 2011-11-28: qty 100

## 2011-11-28 MED ORDER — FENTANYL CITRATE 0.05 MG/ML IJ SOLN
INTRAMUSCULAR | Status: DC | PRN
  Administered 2011-11-28: 100 ug via INTRAVENOUS

## 2011-11-28 MED ORDER — POTASSIUM CHLORIDE IN NACL 20-0.45 MEQ/L-% IV SOLN
INTRAVENOUS | Status: DC
  Administered 2011-11-29: 02:00:00 via INTRAVENOUS
  Administered 2011-11-29: 1000 mL via INTRAVENOUS
  Filled 2011-11-28 (×4): qty 1000

## 2011-11-28 MED ORDER — BUPIVACAINE LIPOSOME 1.3 % IJ SUSP
20.0000 mL | INTRAMUSCULAR | Status: AC
  Administered 2011-11-28: 20 mL
  Filled 2011-11-28: qty 20

## 2011-11-28 MED ORDER — ACETAMINOPHEN 10 MG/ML IV SOLN
1000.0000 mg | Freq: Once | INTRAVENOUS | Status: AC
  Administered 2011-11-28: 1000 mg via INTRAVENOUS

## 2011-11-28 MED ORDER — MENTHOL 3 MG MT LOZG: 1.0000 | LOZENGE | OROMUCOSAL | Status: DC | PRN

## 2011-11-28 MED ORDER — PROMETHAZINE HCL 25 MG/ML IJ SOLN: 6.2500 mg | INTRAMUSCULAR | Status: DC | PRN

## 2011-11-28 MED ORDER — POLYETHYLENE GLYCOL 3350 17 G PO PACK: 17.0000 g | PACK | Freq: Every day | ORAL | Status: DC | PRN

## 2011-11-28 MED ORDER — LACTATED RINGERS IV SOLN
INTRAVENOUS | Status: DC
  Administered 2011-11-28: 17:00:00 via INTRAVENOUS

## 2011-11-28 MED ORDER — METHOCARBAMOL 100 MG/ML IJ SOLN
500.0000 mg | Freq: Four times a day (QID) | INTRAVENOUS | Status: DC | PRN
  Filled 2011-11-28: qty 5

## 2011-11-28 MED ORDER — FLEET ENEMA 7-19 GM/118ML RE ENEM: 1.0000 | ENEMA | Freq: Once | RECTAL | Status: AC | PRN

## 2011-11-28 MED ORDER — HYDROMORPHONE HCL PF 1 MG/ML IJ SOLN: 0.2500 mg | INTRAMUSCULAR | Status: DC | PRN

## 2011-11-28 MED ORDER — LACTATED RINGERS IV SOLN
INTRAVENOUS | Status: DC
  Administered 2011-11-28 (×2): via INTRAVENOUS
  Administered 2011-11-28: 1000 mL via INTRAVENOUS

## 2011-11-28 MED ORDER — ACETAMINOPHEN 325 MG PO TABS: 650.0000 mg | ORAL_TABLET | Freq: Four times a day (QID) | ORAL | Status: DC | PRN

## 2011-11-28 MED ORDER — ACETAMINOPHEN 10 MG/ML IV SOLN
1000.0000 mg | Freq: Four times a day (QID) | INTRAVENOUS | Status: AC
  Administered 2011-11-28 – 2011-11-29 (×4): 1000 mg via INTRAVENOUS
  Filled 2011-11-28 (×5): qty 100

## 2011-11-28 SURGICAL SUPPLY — 52 items
BAG SPEC THK2 15X12 ZIP CLS (MISCELLANEOUS) ×1
BAG ZIPLOCK 12X15 (MISCELLANEOUS) ×2 IMPLANT
BIT DRILL 2.8X128 (BIT) ×2 IMPLANT
BLADE EXTENDED COATED 6.5IN (ELECTRODE) ×2 IMPLANT
BLADE SAW SAG 73X25 THK (BLADE) ×1
BLADE SAW SGTL 73X25 THK (BLADE) ×1 IMPLANT
CLOTH BEACON ORANGE TIMEOUT ST (SAFETY) ×2 IMPLANT
CLSR STERI-STRIP ANTIMIC 1/2X4 (GAUZE/BANDAGES/DRESSINGS) ×1 IMPLANT
DECANTER SPIKE VIAL GLASS SM (MISCELLANEOUS) ×2 IMPLANT
DRAPE INCISE IOBAN 66X45 STRL (DRAPES) ×2 IMPLANT
DRAPE ORTHO SPLIT 77X108 STRL (DRAPES) ×4
DRAPE POUCH INSTRU U-SHP 10X18 (DRAPES) ×2 IMPLANT
DRAPE SURG ORHT 6 SPLT 77X108 (DRAPES) ×2 IMPLANT
DRAPE U-SHAPE 47X51 STRL (DRAPES) ×2 IMPLANT
DRSG ADAPTIC 3X8 NADH LF (GAUZE/BANDAGES/DRESSINGS) ×2 IMPLANT
DRSG MEPILEX BORDER 4X4 (GAUZE/BANDAGES/DRESSINGS) ×2 IMPLANT
DRSG MEPILEX BORDER 4X8 (GAUZE/BANDAGES/DRESSINGS) ×2 IMPLANT
DURAPREP 26ML APPLICATOR (WOUND CARE) ×2 IMPLANT
ELECT REM PT RETURN 9FT ADLT (ELECTROSURGICAL) ×2
ELECTRODE REM PT RTRN 9FT ADLT (ELECTROSURGICAL) ×1 IMPLANT
EVACUATOR 1/8 PVC DRAIN (DRAIN) ×2 IMPLANT
FACESHIELD LNG OPTICON STERILE (SAFETY) ×8 IMPLANT
GLOVE BIO SURGEON STRL SZ7.5 (GLOVE) ×2 IMPLANT
GLOVE BIO SURGEON STRL SZ8 (GLOVE) ×2 IMPLANT
GLOVE BIOGEL PI IND STRL 8 (GLOVE) ×2 IMPLANT
GLOVE BIOGEL PI INDICATOR 8 (GLOVE) ×2
GOWN STRL NON-REIN LRG LVL3 (GOWN DISPOSABLE) ×2 IMPLANT
GOWN STRL REIN XL XLG (GOWN DISPOSABLE) ×2 IMPLANT
IMMOBILIZER KNEE 20 (SOFTGOODS) ×2
IMMOBILIZER KNEE 20 THIGH 36 (SOFTGOODS) IMPLANT
KIT BASIN OR (CUSTOM PROCEDURE TRAY) ×2 IMPLANT
MANIFOLD NEPTUNE II (INSTRUMENTS) ×2 IMPLANT
NDL SAFETY ECLIPSE 18X1.5 (NEEDLE) ×1 IMPLANT
NEEDLE HYPO 18GX1.5 SHARP (NEEDLE) ×2
NS IRRIG 1000ML POUR BTL (IV SOLUTION) ×2 IMPLANT
PACK TOTAL JOINT (CUSTOM PROCEDURE TRAY) ×2 IMPLANT
PASSER SUT SWANSON 36MM LOOP (INSTRUMENTS) ×2 IMPLANT
POSITIONER SURGICAL ARM (MISCELLANEOUS) ×2 IMPLANT
SPONGE GAUZE 4X4 12PLY (GAUZE/BANDAGES/DRESSINGS) ×2 IMPLANT
STRIP CLOSURE SKIN 1/2X4 (GAUZE/BANDAGES/DRESSINGS) ×4 IMPLANT
SUT ETHIBOND NAB CT1 #1 30IN (SUTURE) ×4 IMPLANT
SUT MNCRL AB 4-0 PS2 18 (SUTURE) ×2 IMPLANT
SUT VIC AB 1 CT1 27 (SUTURE) ×4
SUT VIC AB 1 CT1 27XBRD ANTBC (SUTURE) ×2 IMPLANT
SUT VIC AB 2-0 CT1 27 (SUTURE) ×6
SUT VIC AB 2-0 CT1 TAPERPNT 27 (SUTURE) ×3 IMPLANT
SUT VLOC 180 0 24IN GS25 (SUTURE) ×4 IMPLANT
SYR 50ML LL SCALE MARK (SYRINGE) ×2 IMPLANT
TOWEL OR 17X26 10 PK STRL BLUE (TOWEL DISPOSABLE) ×4 IMPLANT
TOWEL OR NON WOVEN STRL DISP B (DISPOSABLE) ×2 IMPLANT
TRAY FOLEY CATH 14FRSI W/METER (CATHETERS) ×2 IMPLANT
WATER STERILE IRR 1500ML POUR (IV SOLUTION) ×2 IMPLANT

## 2011-11-28 NOTE — Op Note (Signed)
Pre-operative diagnosis- Osteoarthritis Right hip  Post-operative diagnosis- Osteoarthritis  Right hip  Procedure-  RightTotal Hip Arthroplasty  Surgeon- Gus Rankin. Janeisha Ryle, MD  Assistant- Avel Peace, PA-C   Anesthesia  Spinal  EBL- 250   Drain Hemovac   Complication- None  Condition-PACU - hemodynamically stable.   Brief Clinical Note-  Ashley Hoover is a 61 y.o. female with end stage arthritis of her right hip with progressively worsening pain and dysfunction. Pain occurs with activity and rest including pain at night. She has tried analgesics, protected weight bearing and rest without benefit. Pain is too severe to attempt physical therapy. Radiographs demonstrate bone on bone arthritis with subchondral cyst formation. She presents now for right THA.   Procedure in detail-   The patient is brought into the operating room and placed on the operating table. After successful administration of Spinal  anesthesia, the patient is placed in the  Left lateral decubitus position with the  Right side up and held in place with the hip positioner. The lower extremity is isolated from the perineum with plastic drapes and time-out is performed by the surgical team. The lower extremity is then prepped and draped in the usual sterile fashion. A short posterolateral incision is made with a ten blade through the subcutaneous tissue to the level of the fascia lata which is incised in line with the skin incision. The sciatic nerve is palpated and protected and the short external rotators and capsule are isolated from the femur. The hip is then dislocated and the center of the femoral head is marked. A trial prosthesis is placed such that the trial head corresponds to the center of the patients' native femoral head. The resection level is marked on the femoral neck and the resection is made with an oscillating saw. The femoral head is removed and femoral retractors placed to gain access to the femoral  canal.      The canal finder is passed into the femoral canal and the canal is thoroughly irrigated with sterile saline to remove the fatty contents. Axial reaming is performed to 11.5  mm, proximal reaming to 16D  and the sleeve machined to a large. A 16D large trial sleeve is placed into the proximal femur.      The femur is then retracted anteriorly to gain acetabular exposure. Acetabular retractors are placed and the labrum and osteophytes are removed, Acetabular reaming is performed to 49  mm and a 50  mm Pinnacle acetabular shell is placed in anatomic position with excellent purchase. Additional dome screws were not needed. An apex hole eliminator is placed and the permanent 32 mm neutral + 4 Marathon liner is placed into the acetabular shell.      The trial femur is then placed into the femoral canal. The size is 16 x 11  stem with a 36 + 6  neck and a 32 + 3 head with the neck version 10 degrees beyond  the patients' native anteversion. The hip is reduced with excellent stability with full extension and full external rotation, 70 degrees flexion with 40 degrees adduction and 90 degrees internal rotation and 90 degrees of flexion with 70 degrees of internal rotation. The operative leg is placed on top of the non-operative leg and the leg lengths are found to be equal. The trials are then removed and the permanent implant of the same size is impacted into the femoral canal. The ceramic femoral head of the same size as the trial is placed  and the hip is reduced with the same stability parameters. The operative leg is again placed on top of the non-operative leg and the leg lengths are found to be equal.      The wound is then copiously irrigated with saline solution and the capsule and short external rotators are re-attached to the femur through drill holes with Ethibond suture. The fascia lata is closed over a hemovac drain with #1 V-loc and the fascia lata, gluteal muscles and subcutaneous tissues are  injected with Exparel 20ml diluted with saline 50ml. The subcutaneous tissues are closed with #1 and2-0 vicryl and the subcuticular layer closed with running 4-0 Monocryl. The drain is hooked to suction, incision cleaned and dried, and steri-srips and a bulky sterile dressing applied. The limb is placed into a knee immobilizer and the patient is awakened and transported to recovery in stable condition.      Please note that a surgical assistant was a medical necessity for this procedure in order to perform it in a safe and expeditious manner. The assistant was necessary to provide retraction to the vital neurovascular structures and to retract and position the limb to allow for anatomic placement of the prosthetic components.  Gus Rankin Myrl Bynum, MD    11/28/2011, 3:33 PM

## 2011-11-28 NOTE — Anesthesia Postprocedure Evaluation (Signed)
  Anesthesia Post-op Note  Patient: Ashley Hoover  Procedure(s) Performed: Procedure(s) (LRB): TOTAL HIP ARTHROPLASTY (Right)  Patient Location: PACU  Anesthesia Type: Spinal  Level of Consciousness: awake and alert   Airway and Oxygen Therapy: Patient Spontanous Breathing  Post-op Pain: mild  Post-op Assessment: Post-op Vital signs reviewed, Patient's Cardiovascular Status Stable, Respiratory Function Stable, Patent Airway and No signs of Nausea or vomiting  Post-op Vital Signs: stable  Complications: No apparent anesthesia complications

## 2011-11-28 NOTE — Preoperative (Signed)
Beta Blockers   Reason not to administer Beta Blockers:Not Applicable 

## 2011-11-28 NOTE — Interval H&P Note (Signed)
History and Physical Interval Note:  11/28/2011 1:48 PM  Ashley Hoover  has presented today for surgery, with the diagnosis of right hip osteoarthritis  The various methods of treatment have been discussed with the patient and family. After consideration of risks, benefits and other options for treatment, the patient has consented to  Procedure(s) (LRB): TOTAL HIP ARTHROPLASTY (Right) as a surgical intervention .  The patient's history has been reviewed, patient examined, no change in status, stable for surgery.  I have reviewed the patient's chart and labs.  Questions were answered to the patient's satisfaction.     Loanne Drilling

## 2011-11-28 NOTE — Progress Notes (Signed)
Right middle knuckle on top of hand "has flared up" swelling on top of hand. No redness but states it is a little painful but no more than usual

## 2011-11-28 NOTE — Transfer of Care (Signed)
Immediate Anesthesia Transfer of Care Note  Patient: Ashley Hoover  Procedure(s) Performed: Procedure(s) (LRB): TOTAL HIP ARTHROPLASTY (Right)  Patient Location: PACU  Anesthesia Type: Spinal  Level of Consciousness: awake, alert , oriented and patient cooperative  Airway & Oxygen Therapy: Patient Spontanous Breathing and Patient connected to face mask oxygen  Post-op Assessment: Report given to PACU RN and Post -op Vital signs reviewed and stable  Post vital signs: Reviewed and stable  Complications: No apparent anesthesia complications

## 2011-11-28 NOTE — Anesthesia Procedure Notes (Signed)
Spinal  Patient location during procedure: OR Staffing Anesthesiologist: Zyanna Leisinger Performed by: anesthesiologist  Preanesthetic Checklist Completed: patient identified, site marked, surgical consent, pre-op evaluation, timeout performed, IV checked, risks and benefits discussed and monitors and equipment checked Spinal Block Patient position: sitting Prep: Betadine Patient monitoring: heart rate, continuous pulse ox and blood pressure Approach: right paramedian Location: L2-3 Injection technique: single-shot Needle Needle type: Spinocan  Needle gauge: 22 G Needle length: 9 cm Additional Notes Expiration date of kit checked and confirmed. Patient tolerated procedure well, without complications.     

## 2011-11-28 NOTE — Anesthesia Preprocedure Evaluation (Addendum)
Anesthesia Evaluation  Patient identified by MRN, date of birth, ID band Patient awake    Reviewed: Allergy & Precautions, H&P , NPO status , Patient's Chart, lab work & pertinent test results  History of Anesthesia Complications (+) AWARENESS UNDER ANESTHESIA  Airway Mallampati: I TM Distance: >3 FB Neck ROM: Full    Dental No notable dental hx.    Pulmonary neg pulmonary ROS,  breath sounds clear to auscultation  Pulmonary exam normal       Cardiovascular hypertension, Pt. on medications + angina (neg cardiac w/u in past per pt report) negative cardio ROS  - dysrhythmias Rhythm:Regular Rate:Normal     Neuro/Psych PSYCHIATRIC DISORDERS Anxiety Depression negative neurological ROS  negative psych ROS   GI/Hepatic negative GI ROS, Neg liver ROS,   Endo/Other  negative endocrine ROS  Renal/GU negative Renal ROS  negative genitourinary   Musculoskeletal negative musculoskeletal ROS (+) Fibromyalgia -  Abdominal   Peds negative pediatric ROS (+)  Hematology negative hematology ROS (+)   Anesthesia Other Findings   Reproductive/Obstetrics negative OB ROS                          Anesthesia Physical Anesthesia Plan  ASA: II  Anesthesia Plan: Spinal   Post-op Pain Management:    Induction:   Airway Management Planned: Simple Face Mask  Additional Equipment:   Intra-op Plan:   Post-operative Plan:   Informed Consent: I have reviewed the patients History and Physical, chart, labs and discussed the procedure including the risks, benefits and alternatives for the proposed anesthesia with the patient or authorized representative who has indicated his/her understanding and acceptance.   Dental advisory given  Plan Discussed with: CRNA  Anesthesia Plan Comments:         Anesthesia Quick Evaluation

## 2011-11-29 LAB — BASIC METABOLIC PANEL
Chloride: 100 mEq/L (ref 96–112)
Creatinine, Ser: 0.85 mg/dL (ref 0.50–1.10)
GFR calc Af Amer: 83 mL/min — ABNORMAL LOW (ref >90)
Potassium: 3.8 mEq/L (ref 3.5–5.1)
Sodium: 135 mEq/L (ref 135–145)

## 2011-11-29 LAB — CBC
MCV: 87 fL (ref 78.0–100.0)
Platelets: 178 10*3/uL (ref 150–400)
RDW: 13.4 % (ref 11.5–15.5)
WBC: 11.1 10*3/uL — ABNORMAL HIGH (ref 4.0–10.5)

## 2011-11-29 NOTE — Evaluation (Signed)
Occupational Therapy Evaluation Patient Details Name: Ashley Hoover MRN: [id number] DOB: 01/09/1951 Today's Date: 11/29/2011 Time: 4098-1191 OT Time Calculation (min): 30 min  OT Assessment / Plan / Recommendation Clinical Impression  Pt is s/p R THA and displays decreased strength and functional mobility and decreased knowledge of hip precautions. Will benefit from skilled OT services to improve ADL independence and safety.    OT Assessment  Patient needs continued OT Services    Follow Up Recommendations  No OT follow up    Barriers to Discharge      Equipment Recommendations  None recommended by PT;None recommended by OT    Recommendations for Other Services    Frequency  Min 2X/week    Precautions / Restrictions Precautions Precautions: Posterior Hip Precaution Comments: Pt able to state 2 precautions. Reviewed all with pt Restrictions Weight Bearing Restrictions: No Other Position/Activity Restrictions: WBAT        ADL  Eating/Feeding: Simulated;Independent Where Assessed - Eating/Feeding: Chair Grooming: Simulated;Wash/dry face;Set up Where Assessed - Grooming: Unsupported sitting Upper Body Bathing: Performed;Chest;Right arm;Left arm;Abdomen;Set up;Supervision/safety Where Assessed - Upper Body Bathing: Unsupported sitting Lower Body Bathing: Simulated;Moderate assistance Where Assessed - Lower Body Bathing: Supported sit to stand Upper Body Dressing: Simulated;Set up;Supervision/safety Where Assessed - Upper Body Dressing: Unsupported sitting Lower Body Dressing: Simulated;Maximal assistance;Other (comment) (without adaptive equipment) Where Assessed - Lower Body Dressing: Supported sit to Pharmacist, hospital: Mining engineer Method: Sit to stand Toileting - Clothing Manipulation and Hygiene: Simulated;Minimal assistance Where Assessed - Engineer, mining and Hygiene: Standing Tub/Shower Transfer Method: Not  assessed Equipment Used: Rolling walker ADL Comments: Reviewed all hip precautions after pt only able to state 2/3. Pt educated on AE options and is interested in Scientist, water quality. Has not practiced with AE hands on yet.    OT Diagnosis: Generalized weakness  OT Problem List: Decreased strength;Decreased knowledge of use of DME or AE;Decreased knowledge of precautions OT Treatment Interventions: Self-care/ADL training;Therapeutic activities;DME and/or AE instruction;Patient/family education   OT Goals Acute Rehab OT Goals OT Goal Formulation: With patient Time For Goal Achievement: 12/06/11 Potential to Achieve Goals: Good ADL Goals Pt Will Perform Grooming: with supervision;Standing at sink ADL Goal: Grooming - Progress: Goal set today Pt Will Perform Lower Body Bathing: with supervision;Sit to stand from chair;Sit to stand from bed;with adaptive equipment ADL Goal: Lower Body Bathing - Progress: Goal set today Pt Will Perform Lower Body Dressing: with supervision;Sit to stand from bed;Sit to stand from chair;with adaptive equipment ADL Goal: Lower Body Dressing - Progress: Goal set today Pt Will Transfer to Toilet: with supervision;Ambulation;with DME;3-in-1;Maintaining hip precautions ADL Goal: Toilet Transfer - Progress: Goal set today Pt Will Perform Toileting - Clothing Manipulation: with supervision;Standing ADL Goal: Toileting - Clothing Manipulation - Progress: Goal set today Pt Will Perform Toileting - Hygiene: with supervision;Sit to stand from 3-in-1/toilet ADL Goal: Toileting - Hygiene - Progress: Goal set today Pt Will Perform Tub/Shower Transfer: with min assist;with DME;Maintaining hip precautions ADL Goal: Tub/Shower Transfer - Progress: Goal set today  Visit Information  Last OT Received On: 11/29/11 Assistance Needed: +1    Subjective Data  Subjective: I love to be clean Patient Stated Goal: agreeable to do bath; none stated   Prior  Functioning  Vision/Perception  Home Living Lives With: Spouse Available Help at Discharge: Family Type of Home: House Home Access: Stairs to enter [employer] of Steps: 3-4 Entrance Stairs-Rails: None Home Layout: Two level;Laundry or work area in basement [employer]: [employer]  shower Bathroom Toilet: Standard Home Adaptive Equipment: Bedside commode/3-in-1;Walker - rolling;Wheelchair - manual Prior Function Level of Independence: Independent Able to Take Stairs?: Yes Driving: Yes Communication Communication: No difficulties      Cognition  Overall Cognitive Status: Appears within functional limits for tasks assessed/performed Arousal/Alertness: Awake/alert Orientation Level: Appears intact for tasks assessed Behavior During Session: [facility] for tasks performed    Extremity/Trunk Assessment Right Upper Extremity Assessment RUE ROM/Strength/Tone: [facility] for tasks assessed Left Upper Extremity Assessment LUE ROM/Strength/Tone: [facility] for tasks assessed Right Lower Extremity Assessment RLE ROM/Strength/Tone: Deficits RLE ROM/Strength/Tone Deficits: can initiate hip abd/add in bed with some assist from therapist, can flex knee approx 40 deg.  RLE Sensation: WFL - Light Touch RLE Coordination: WFL - gross motor Left Lower Extremity Assessment LLE ROM/Strength/Tone: WFL for tasks assessed LLE Sensation: WFL - Light Touch LLE Coordination: WFL - gross motor Trunk Assessment Trunk Assessment: Normal   Mobility Bed Mobility Bed Mobility: Supine to Sit;Sitting - Scoot to Edge of Bed Supine to Sit: 4: Min assist;HOB elevated;With rails Sitting - Scoot to Edge of Bed: 4: Min assist Details for Bed Mobility Assistance: Min assist for RLE out of bed with cues for hand placement on bed to self assist trunk.   Transfers Transfers: Sit to Stand;Stand to Sit Sit to Stand: 4: Min assist;With upper extremity assist;From chair/3-in-1 Stand to Sit: 4: Min assist;With upper  extremity assist;To chair/3-in-1 Details for Transfer Assistance: min verbal cues for hand placement and R LE forward to maintain hip precautions.   Exercise    Balance    End of Session OT - End of Session Activity Tolerance: Patient tolerated treatment well Patient left: in chair;with call bell/phone within reach;with family/visitor present;Other (comment) (husband present at end of session)  GO     Lennox Laity [phone] 11/29/2011, 10:45 AM

## 2011-11-29 NOTE — Progress Notes (Signed)
Physical Therapy Treatment Patient Details Name: Ashley Hoover MRN: [id number] DOB: 11/16/1950 Today's Date: 11/29/2011 Time: 0865-7846 PT Time Calculation (min): 12 min  PT Assessment / Plan / Recommendation Comments on Treatment Session  Pt progressing well with exercises this afternoon.      Follow Up Recommendations  Home health PT    Barriers to Discharge        Equipment Recommendations  None recommended by PT;None recommended by OT    Recommendations for Other Services OT consult  Frequency 7X/week   Plan Discharge plan remains appropriate    Precautions / Restrictions Precautions Precautions: Posterior Hip Precaution Comments: Pt able to recall 3/3 precautions during pm session.  Restrictions Weight Bearing Restrictions: No Other Position/Activity Restrictions: WBAT   Pertinent Vitals/Pain 2/10    Mobility  Bed Mobility Bed Mobility: Not assessed Supine to Sit: 4: Min assist;HOB elevated;With rails Sit to Supine: 4: Min assist Details for Bed Mobility Assistance: Assist for RLE into and out of bed with cues for hand placement on bed.  Doing better with UE placement during pm session.  Transfers Transfers: Not assessed Sit to Stand: 4: Min guard;From elevated surface;With upper extremity assist;From bed Stand to Sit: 4: Min guard;With upper extremity assist;To bed Details for Transfer Assistance: min/guard for safety with cues for hand placement and LE management when sitting/standing.  Ambulation/Gait Ambulation/Gait Assistance: Not tested (comment) Ambulation Distance (Feet): 60 Feet Assistive device: Rolling walker Ambulation/Gait Assistance Details: Min cues for sequencing/technique for RW and to maintain upright posture.  Also practiced turning towards operative leg.   Gait Pattern: Step-to pattern;Decreased stance time - right;Decreased step length - left;Trunk flexed Gait velocity: decreased Stairs: No Wheelchair Mobility Wheelchair Mobility: No    Exercises Total Joint Exercises Ankle Circles/Pumps: AROM;Both;20 reps Quad Sets: AROM;Strengthening;Both;10 reps Gluteal Sets: Strengthening;Both;10 reps Short Arc Quad: AROM;Right;10 reps Heel Slides: AAROM;Right;10 reps Hip ABduction/ADduction: AAROM;Right;10 reps   PT Diagnosis:    PT Problem List:   PT Treatment Interventions:     PT Goals Acute Rehab PT Goals Performed exercise program to progress towards all mobility goals.     Visit Information  Last PT Received On: 11/29/11 Assistance Needed: +1    Subjective Data  Subjective: It feels good to be up Patient Stated Goal: to get home   Cognition  Overall Cognitive Status: Appears within functional limits for tasks assessed/performed Arousal/Alertness: Awake/alert Orientation Level: Appears intact for tasks assessed Behavior During Session: [facility] for tasks performed    Balance     End of Session PT - End of Session Activity Tolerance: Patient tolerated treatment well Patient left: in bed;with call bell/phone within reach Nurse Communication: Mobility status   GP     Page, Meribeth Mattes 11/29/2011, 4:59 PM

## 2011-11-29 NOTE — Evaluation (Signed)
Physical Therapy Evaluation Patient Details Name: Ashley Hoover MRN: [id number] DOB: [birth year] Today's Date: 11/29/2011 Time: 4098-1191 PT Time Calculation (min): 29 min  PT Assessment / Plan / Recommendation Clinical Impression  Pt presents s/p R THA (posterior) POD 1 with decreased strength, ROM and mobility.  Tolerated bed mobility, transfers and ambulation well, stating that it feels good to be up.  Pt will benefit from skilled PT in acute venue to address deficits.  PT recommends HHPT for follow up therapy at D/C to assist in returning pt to PLOF.      PT Assessment  Patient needs continued PT services    Follow Up Recommendations  Home health PT    Barriers to Discharge None      Equipment Recommendations  None recommended by PT    Recommendations for Other Services OT consult   Frequency 7X/week    Precautions / Restrictions Precautions Precautions: Posterior Hip Precaution Comments: Pt able to recall 2/3 hip precautions, educated on all 3.  Restrictions Weight Bearing Restrictions: No Other Position/Activity Restrictions: WBAT   Pertinent Vitals/Pain 3-4/10      Mobility  Bed Mobility Bed Mobility: Supine to Sit;Sitting - Scoot to Edge of Bed Supine to Sit: 4: Min assist;HOB elevated;With rails Sitting - Scoot to Edge of Bed: 4: Min assist Details for Bed Mobility Assistance: Min assist for RLE out of bed with cues for hand placement on bed to self assist trunk.   Transfers Transfers: Sit to Stand;Stand to Sit Sit to Stand: 4: Min assist;From elevated surface;With upper extremity assist;From bed Stand to Sit: 4: Min assist;With upper extremity assist;With armrests;To chair/3-in-1 Details for Transfer Assistance: Min assist to rise and steady with cues for hand placement and LE management in order to maintain hip precautions when sitting/standing.  Ambulation/Gait Ambulation/Gait Assistance: 4: Min assist Ambulation Distance (Feet): 40 Feet Assistive  device: Rolling walker Ambulation/Gait Assistance Details: Cues for sequencing/technique with RW, turning towards strong side and how to turn towards operative side if needed and to maintain upright posture.  Gait Pattern: Step-to pattern;Decreased stance time - right;Decreased step length - left;Trunk flexed Gait velocity: decreased Stairs: No Wheelchair Mobility Wheelchair Mobility: No    Exercises     PT Diagnosis: Abnormality of gait;Generalized weakness;Acute pain  PT Problem List: Decreased strength;Decreased range of motion;Decreased activity tolerance;Decreased balance;Decreased mobility;Decreased knowledge of use of DME;Pain;Decreased knowledge of precautions PT Treatment Interventions: DME instruction;Gait training;Functional mobility training;Stair training;Therapeutic activities;Therapeutic exercise;Balance training;Patient/family education   PT Goals Acute Rehab PT Goals PT Goal Formulation: With patient Time For Goal Achievement: 12/02/11 Potential to Achieve Goals: Good Pt will go Supine/Side to Sit: with supervision PT Goal: Supine/Side to Sit - Progress: Goal set today Pt will go Sit to Supine/Side: with supervision PT Goal: Sit to Supine/Side - Progress: Goal set today Pt will go Sit to Stand: with supervision PT Goal: Sit to Stand - Progress: Goal set today Pt will Ambulate: 51 - 150 feet;with supervision;with least restrictive assistive device PT Goal: Ambulate - Progress: Goal set today Pt will Go Up / Down Stairs: 3-5 stairs;with min assist;with least restrictive assistive device PT Goal: Up/Down Stairs - Progress: Goal set today  Visit Information  Last PT Received On: 11/29/11 Assistance Needed: +1    Subjective Data  Subjective: 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  Patient Stated Goal: to get home   Prior Functioning  Home Living Lives With: Spouse Available Help at Discharge: Family Type of Home: House Home Access: Stairs to enter [employer]  of Steps: 3-4 Entrance Stairs-Rails: None Home Layout: Two level;Laundry or work area in basement [employer] Shower/Tub: Health visitor: Standard Home Adaptive Equipment: Bedside commode/3-in-1;Walker - rolling;Wheelchair - manual Prior Function Level of Independence: Independent Able to Take Stairs?: Yes Driving: Yes Communication Communication: No difficulties    Cognition  Overall Cognitive Status: Appears within functional limits for tasks assessed/performed Arousal/Alertness: Awake/alert Orientation Level: Appears intact for tasks assessed Behavior During Session: [facility] for tasks performed    Extremity/Trunk Assessment Right Lower Extremity Assessment RLE ROM/Strength/Tone: Deficits RLE ROM/Strength/Tone Deficits: can initiate hip abd/add in bed with some assist from therapist, can flex knee approx 40 deg.  RLE Sensation: WFL - Light Touch RLE Coordination: WFL - gross motor Left Lower Extremity Assessment LLE ROM/Strength/Tone: WFL for tasks assessed LLE Sensation: WFL - Light Touch LLE Coordination: WFL - gross motor Trunk Assessment Trunk Assessment: Normal   Balance    End of Session PT - End of Session Equipment Utilized During Treatment: Gait belt Activity Tolerance: Patient tolerated treatment well Patient left: in chair;with call bell/phone within reach Nurse Communication: Mobility status  GP     Page, Meribeth Mattes 11/29/2011, 9:21 AM

## 2011-11-29 NOTE — Progress Notes (Signed)
Physical Therapy Treatment Patient Details Name: Ashley Hoover MRN: [id number] DOB: 1951/03/09 Today's Date: 11/29/2011 Time: 4098-1191 PT Time Calculation (min): 20 min  PT Assessment / Plan / Recommendation Comments on Treatment Session  Pt progressing with ambulation distance, however at slow gait speed.      Follow Up Recommendations  Home health PT    Barriers to Discharge        Equipment Recommendations  None recommended by PT;None recommended by OT    Recommendations for Other Services OT consult  Frequency 7X/week   Plan Discharge plan remains appropriate    Precautions / Restrictions Precautions Precautions: Posterior Hip Precaution Comments: Pt able to recall 3/3 precautions during pm session.  Restrictions Weight Bearing Restrictions: No Other Position/Activity Restrictions: WBAT   Pertinent Vitals/Pain 3/10    Mobility  Bed Mobility Bed Mobility: Supine to Sit;Sit to Supine Supine to Sit: 4: Min assist;HOB elevated;With rails Sit to Supine: 4: Min assist Details for Bed Mobility Assistance: Assist for RLE into and out of bed with cues for hand placement on bed.  Doing better with UE placement during pm session.  Transfers Transfers: Sit to Stand;Stand to Sit Sit to Stand: 4: Min guard;From elevated surface;With upper extremity assist;From bed Stand to Sit: 4: Min guard;With upper extremity assist;To bed Details for Transfer Assistance: min/guard for safety with cues for hand placement and LE management when sitting/standing.  Ambulation/Gait Ambulation/Gait Assistance: 4: Min guard Ambulation Distance (Feet): 60 Feet Assistive device: Rolling walker Ambulation/Gait Assistance Details: Min cues for sequencing/technique for RW and to maintain upright posture.  Also practiced turning towards operative leg.   Gait Pattern: Step-to pattern;Decreased stance time - right;Decreased step length - left;Trunk flexed Gait velocity: decreased Stairs: No Wheelchair  Mobility Wheelchair Mobility: No    Exercises     PT Diagnosis:    PT Problem List:   PT Treatment Interventions:     PT Goals Acute Rehab PT Goals PT Goal Formulation: With patient Time For Goal Achievement: 12/02/11 Potential to Achieve Goals: Good Pt will go Supine/Side to Sit: with supervision PT Goal: Supine/Side to Sit - Progress: Progressing toward goal Pt will go Sit to Supine/Side: with supervision PT Goal: Sit to Supine/Side - Progress: Progressing toward goal Pt will go Sit to Stand: with supervision PT Goal: Sit to Stand - Progress: Progressing toward goal Pt will Ambulate: 51 - 150 feet;with supervision;with least restrictive assistive device PT Goal: Ambulate - Progress: Progressing toward goal  Visit Information  Last PT Received On: 11/29/11 Assistance Needed: +1    Subjective Data  Subjective: It feels good to be up Patient Stated Goal: to get home   Cognition  Overall Cognitive Status: Appears within functional limits for tasks assessed/performed Arousal/Alertness: Awake/alert Orientation Level: Appears intact for tasks assessed Behavior During Session: [facility] for tasks performed    Balance     End of Session PT - End of Session Activity Tolerance: Patient tolerated treatment well Patient left: in bed;with call bell/phone within reach Nurse Communication: Mobility status   GP     Page, Meribeth Mattes 11/29/2011, 4:26 PM

## 2011-11-29 NOTE — Progress Notes (Signed)
[name] spoke with patient concerning discharge planning. Pt's spouse present at bedside during interview. Per pt choice [facility] to provide [facility] services upon discharge. Patient states having access to DME from previous surgery. Spouse to assist in home care. No other needs stated.   Ashley Hoover [phone]

## 2011-11-29 NOTE — Progress Notes (Signed)
Orthopedics Progress Note  Subjective: Pt states she feels much better than yesterday Night was rough but feeling much better this morning   Objective:  Filed Vitals:   11/29/11 0552  BP: 121/74  Pulse: 81  Temp: 97.6 F (36.4 C)  Resp: 14    General: Awake and alert  Musculoskeletal: right hip dressing intact, nv intact distally, drain pulled Neurovascularly intact  Lab Results  Component Value Date   WBC 11.1* 11/29/2011   HGB 11.7* 11/29/2011   HCT 34.7* 11/29/2011   MCV 87.0 11/29/2011   PLT 178 11/29/2011       Component Value Date/Time   NA 135 11/29/2011 0435   K 3.8 11/29/2011 0435   CL 100 11/29/2011 0435   CO2 28 11/29/2011 0435   GLUCOSE 134* 11/29/2011 0435   BUN 17 11/29/2011 0435   CREATININE 0.85 11/29/2011 0435   CALCIUM 8.6 11/29/2011 0435   GFRNONAA 71* 11/29/2011 0435   GFRAA 83* 11/29/2011 0435    Lab Results  Component Value Date   INR 0.99 11/24/2011   INR 1.53* 03/08/2009   INR 1.39 03/07/2009    Assessment/Plan: POD #1 s/p Procedure(s):right TOTAL HIP ARTHROPLASTY  PT/OT  Pain control D/c planning Pt doing well  Viviann Spare R. Ranell Patrick, MD 11/29/2011 6:54 AM

## 2011-11-30 LAB — CBC
HCT: 33.9 % — ABNORMAL LOW (ref 36.0–46.0)
MCHC: 33.9 g/dL (ref 30.0–36.0)
RDW: 13.7 % (ref 11.5–15.5)
WBC: 11.6 10*3/uL — ABNORMAL HIGH (ref 4.0–10.5)

## 2011-11-30 LAB — BASIC METABOLIC PANEL
BUN: 13 mg/dL (ref 6–23)
Chloride: 99 mEq/L (ref 96–112)
GFR calc Af Amer: 71 mL/min — ABNORMAL LOW (ref >90)
GFR calc non Af Amer: 62 mL/min — ABNORMAL LOW (ref >90)
Potassium: 3.3 mEq/L — ABNORMAL LOW (ref 3.5–5.1)
Sodium: 135 mEq/L (ref 135–145)

## 2011-11-30 NOTE — Progress Notes (Signed)
Subjective:  patient feels like she doing well is already up walking this morning with physical therapy minimal amount of pain in her hi     Objective: Vital signs in last 24 hours: Temp:  [97.7 F (36.5 C)-99.3 F (37.4 C)] 99.3 F (37.4 C) (07/21 0544) Pulse Rate:  [86-97] 97  (07/21 0544) Resp:  [16-18] 18  (07/21 0544) BP: (109-120)/(66-72) 109/68 mmHg (07/21 0544) SpO2:  [94 %-97 %] 94 % (07/21 0544)  Intake/Output from previous day: 07/20 0701 - 07/21 0700 In: 1962.4 [P.O.:720; I.V.:1242.4] Out: 1325 [Urine:1325] Intake/Output this shift:     Basename 11/30/11 0413 11/29/11 0435  HGB 11.5* 11.7*    Basename 11/30/11 0413 11/29/11 0435  WBC 11.6* 11.1*  RBC 3.85* 3.99  HCT 33.9* 34.7*  PLT 183 178    Basename 11/30/11 0413 11/29/11 0435  NA 135 135  K 3.3* 3.8  CL 99 100  CO2 27 28  BUN 13 17  CREATININE 0.96 0.85  GLUCOSE 135* 134*  CALCIUM 8.5 8.6   No results found for this basename: LABPT:2,INR:2 in the last 72 hours  Patient's conscious alert appropriate sitting up a bedside chair appears to be in no distress. Her right hip dressing was taken down she is no drainage well approximate Steri-Strips no signs of infection her thigh and calf are soft nontender foot neuromotor vascularly intact  Assessment/Plan: Postop day #2 status post right total hip arthroplasty doing well Plan we'll DC IV fluids Hep-Lock IV cath out of bed with physical therapy plan possible discharge to home on Monday after being evaluated by Dr. Leticia Penna 11/30/2011, 9:08 AM

## 2011-11-30 NOTE — Progress Notes (Signed)
Occupational Therapy Treatment Patient Details Name: Ashley Hoover MRN: [id number] DOB: 1951/04/02 Today's Date: 11/30/2011 Time: 4098-1191 OT Time Calculation (min): 35 min  OT Assessment / Plan / Recommendation Comments on Treatment Session Pt. doing well with BADLs - currently requires min A.  Demonstrates good understanding of THA precautions    Follow Up Recommendations  No OT follow up    Barriers to Discharge       Equipment Recommendations  None recommended by PT;None recommended by OT    Recommendations for Other Services    Frequency Min 2X/week   Plan Discharge plan remains appropriate    Precautions / Restrictions Precautions Precautions: Posterior Hip Precaution Comments: Pt able to recall 3/3 precautions Restrictions Weight Bearing Restrictions: No Other Position/Activity Restrictions: WBAT   Pertinent Vitals/Pain     ADL  Lower Body Dressing: Performed;Minimal assistance (with AE) Where Assessed - Lower Body Dressing: Supported sit to stand Toilet Transfer: Minimal assistance;Performed Statistician Method: Sit to Barista: Raised toilet seat with arms (or 3-in-1 over toilet) Toileting - Clothing Manipulation and Hygiene: Performed;Minimal assistance Where Assessed - Engineer, mining and Hygiene: Standing Equipment Used: Rolling walker;Sock aid;Reacher;Long-handled sponge;Long-handled shoe horn Transfers/Ambulation Related to ADLs: min guard assist ADL Comments: Pt. able to state hip precautions - required one verbal cue to maintain during ADLs.  Pt. instructed in use of AE for LB ADLs, and was able to perform LB ADLs with min A.    OT Diagnosis:    OT Problem List:   OT Treatment Interventions:     OT Goals ADL Goals ADL Goal: Lower Body Dressing - Progress: Progressing toward goals ADL Goal: Toilet Transfer - Progress: Progressing toward goals ADL Goal: Toileting - Clothing Manipulation - Progress:  Progressing toward goals ADL Goal: Toileting - Hygiene - Progress: Progressing toward goals  Visit Information  Last OT Received On: 11/30/11 Assistance Needed: +1    Subjective Data      Prior Functioning       Cognition  Overall Cognitive Status: Appears within functional limits for tasks assessed/performed Arousal/Alertness: Awake/alert Orientation Level: Appears intact for tasks assessed Behavior During Session: [facility] for tasks performed    Mobility  Transfers: Sit to Stand;Stand to Sit Sit to Stand: 5: Supervision;With upper extremity assist;From chair/3-in-1;With armrests Stand to Sit: 5: Supervision;With upper extremity assist;With armrests;To chair/3-in-1 Details for Transfer Assistance: Requires verbal cues to kick Rt. LE forward to avoid bending too far   Exercises   Balance    End of Session OT - End of Session Activity Tolerance: Patient tolerated treatment well Patient left: in chair;with call bell/phone within reach;with family/visitor present;Other (comment)  GO     Ashley Hoover, Ursula Alert M 11/30/2011, 12:23 PM

## 2011-11-30 NOTE — Progress Notes (Signed)
Physical Therapy Treatment Patient Details Name: Ashley Hoover MRN: [id number] DOB: 03-24-1951 Today's Date: 11/30/2011 Time: 4098-1191 PT Time Calculation (min): 38 min  PT Assessment / Plan / Recommendation Comments on Treatment Session  Pt continues to do very well with ambulation and exercises.  If pt to D/C today then will practice stairs during pm session before D/C.     Follow Up Recommendations  Home health PT    Barriers to Discharge        Equipment Recommendations  None recommended by PT;None recommended by OT    Recommendations for Other Services    Frequency 7X/week   Plan Discharge plan remains appropriate    Precautions / Restrictions Precautions Precautions: Posterior Hip Precaution Comments: Pt able to recall 3/3 precautions Restrictions Weight Bearing Restrictions: No Other Position/Activity Restrictions: WBAT   Pertinent Vitals/Pain 3/10    Mobility  Bed Mobility Bed Mobility: Not assessed (Pt was in recliner when PT arrived. ) Transfers Transfers: Sit to Stand;Stand to Sit Sit to Stand: 5: Supervision;With upper extremity assist;From chair/3-in-1;With armrests Stand to Sit: 5: Supervision;With upper extremity assist;With armrests;To chair/3-in-1 Details for Transfer Assistance: Performed transfer x 2 in order to use 3in1 over toilet.  Min cues for hand placement and LE management.  Ambulation/Gait Ambulation/Gait Assistance: 5: Supervision Ambulation Distance (Feet): 130 Feet (another 15') Assistive device: Rolling walker Ambulation/Gait Assistance Details: Min cues for relaxed posture and heel to toe contact with RLE Gait Pattern: Step-to pattern;Decreased stance time - right;Decreased step length - left;Trunk flexed Gait velocity: decreased    Exercises Total Joint Exercises Ankle Circles/Pumps: AROM;Both;20 reps Quad Sets: AROM;Strengthening;Both;10 reps Gluteal Sets: Strengthening;Both;10 reps Short Arc Quad: AROM;Right;10 reps Heel  Slides: AAROM;Right;10 reps Hip ABduction/ADduction: AAROM;Right;10 reps   PT Diagnosis:    PT Problem List:   PT Treatment Interventions:     PT Goals Acute Rehab PT Goals PT Goal Formulation: With patient Time For Goal Achievement: 12/02/11 Potential to Achieve Goals: Good Pt will go Sit to Stand: with supervision PT Goal: Sit to Stand - Progress: Met Pt will Ambulate: 51 - 150 feet;with supervision;with least restrictive assistive device PT Goal: Ambulate - Progress: Met  Visit Information  Last PT Received On: 11/30/11 Assistance Needed: +1    Subjective Data  Subjective: It feels better the more I walk.  Patient Stated Goal: to get home   Cognition  Overall Cognitive Status: Appears within functional limits for tasks assessed/performed Arousal/Alertness: Awake/alert Orientation Level: Appears intact for tasks assessed Behavior During Session: [facility] for tasks performed    Balance     End of Session PT - End of Session Activity Tolerance: Patient tolerated treatment well Patient left: in chair;with call bell/phone within reach;with family/visitor present Nurse Communication: Mobility status   GP     Page, Meribeth Mattes 11/30/2011, 8:36 AM

## 2011-11-30 NOTE — Progress Notes (Signed)
Physical Therapy Treatment Patient Details Name: Ashley Hoover MRN: [id number] DOB: February 11, 1951 Today's Date: 11/30/2011 Time: 4098-1191 PT Time Calculation (min): 32 min  PT Assessment / Plan / Recommendation Comments on Treatment Session  Pt practiced stairs first time this afternoon.  did well but will practice again with husband before D/C for improved safety.     Follow Up Recommendations  Home health PT    Barriers to Discharge        Equipment Recommendations  None recommended by PT;None recommended by OT    Recommendations for Other Services    Frequency 7X/week   Plan Discharge plan remains appropriate    Precautions / Restrictions Precautions Precautions: Posterior Hip Precaution Comments: Pt able to recall 3/3 precautions Restrictions Weight Bearing Restrictions: No Other Position/Activity Restrictions: WBAT   Pertinent Vitals/Pain 3/10    Mobility  Bed Mobility Bed Mobility: Sit to Supine Sit to Supine: 4: Min assist Details for Bed Mobility Assistance: Assist for RLE into bed with min cues for hand placement.  Transfers Transfers: Sit to Stand;Stand to Sit Sit to Stand: 5: Supervision;With upper extremity assist;From chair/3-in-1;With armrests Stand to Sit: 5: Supervision;With upper extremity assist;To bed Details for Transfer Assistance: Min cues for hand placement and LE management.  Performed x 2 to allow rest break before practicing stairs.  Ambulation/Gait Ambulation/Gait Assistance: 5: Supervision Ambulation Distance (Feet): 160 Feet Assistive device: Rolling walker Ambulation/Gait Assistance Details: Min cues for maintaining hip precautions when turning towards R side.  Gait Pattern: Step-to pattern;Decreased stance time - right;Decreased step length - left;Trunk flexed Gait velocity: decreased Stairs: Yes Stairs Assistance: 4: Min assist Stairs Assistance Details (indicate cue type and reason): Requires cues for sequencing/technique with RW.   Pt states she may want to practice again tomorrow while husband is present.  Stair Management Technique: No rails;Step to pattern;Backwards;Forwards;With walker Number of Stairs: 4     Exercises     PT Diagnosis:    PT Problem List:   PT Treatment Interventions:     PT Goals Acute Rehab PT Goals PT Goal Formulation: With patient Time For Goal Achievement: 12/02/11 Potential to Achieve Goals: Good Pt will go Sit to Supine/Side: with supervision PT Goal: Sit to Supine/Side - Progress: Progressing toward goal Pt will go Sit to Stand: with supervision PT Goal: Sit to Stand - Progress: Met Pt will Ambulate: 51 - 150 feet;with supervision;with least restrictive assistive device PT Goal: Ambulate - Progress: Met Pt will Go Up / Down Stairs: 3-5 stairs;with min assist;with least restrictive assistive device PT Goal: Up/Down Stairs - Progress: Progressing toward goal  Visit Information  Last PT Received On: 11/30/11 Assistance Needed: +1    Subjective Data  Subjective: Its always stiff when I first get up  Patient Stated Goal: to get home   Cognition  Overall Cognitive Status: Appears within functional limits for tasks assessed/performed Arousal/Alertness: Awake/alert Orientation Level: Appears intact for tasks assessed Behavior During Session: [facility] for tasks performed    Balance     End of Session PT - End of Session Activity Tolerance: Patient tolerated treatment well Patient left: in bed;with call bell/phone within reach Nurse Communication: Mobility status   GP     Page, Meribeth Mattes 11/30/2011, 2:22 PM

## 2011-12-01 ENCOUNTER — Encounter (HOSPITAL_COMMUNITY): Payer: Self-pay | Admitting: Orthopedic Surgery

## 2011-12-01 LAB — CBC
HCT: 33.3 % — ABNORMAL LOW (ref 36.0–46.0)
Hemoglobin: 11.3 g/dL — ABNORMAL LOW (ref 12.0–15.0)
RBC: 3.79 MIL/uL — ABNORMAL LOW (ref 3.87–5.11)
WBC: 11.7 10*3/uL — ABNORMAL HIGH (ref 4.0–10.5)

## 2011-12-01 MED ORDER — METHOCARBAMOL 500 MG PO TABS: 500.0000 mg | ORAL_TABLET | Freq: Four times a day (QID) | ORAL | Status: AC | PRN

## 2011-12-01 MED ORDER — OXYCODONE HCL 5 MG PO TABS: 5.0000 mg | ORAL_TABLET | ORAL | Status: AC | PRN

## 2011-12-01 MED ORDER — RIVAROXABAN 10 MG PO TABS: 10.0000 mg | ORAL_TABLET | Freq: Every day | ORAL | Status: DC

## 2011-12-01 NOTE — Plan of Care (Signed)
Problem: Consults Goal: Diagnosis- Total Joint Replacement Outcome: Completed/Met Date Met:  12/01/11 Right Posterior Hip

## 2011-12-01 NOTE — Progress Notes (Signed)
Pt to d/c home. AVS reviewed. Pt capable of verbalizing medications and follow-up appointments. Remains hemodynamically stable. No signs and symptoms of distress. Educated pt to return to ER in the case of SOB, dizziness, or chest pain.   

## 2011-12-01 NOTE — Progress Notes (Signed)
   Subjective: 3 Days Post-Op Procedure(s) (LRB): TOTAL HIP ARTHROPLASTY (Right) Patient reports pain as mild.   Doing great and ready to go home Plan is to go Home after hospital stay.  Objective: Vital signs in last 24 hours: Temp:  [99.6 F (37.6 C)-100 F (37.8 C)] 99.6 F (37.6 C) (07/22 0422) Pulse Rate:  [91-92] 91  (07/22 0422) Resp:  [16] 16  (07/22 0422) BP: (104-124)/(71-72) 124/72 mmHg (07/22 0422) SpO2:  [92 %-94 %] 94 % (07/22 0422)  Intake/Output from previous day:  Intake/Output Summary (Last 24 hours) at 12/01/11 0651 Last data filed at 11/30/11 1629  Gross per 24 hour  Intake 818.75 ml  Output   1100 ml  Net -281.25 ml    Intake/Output this shift:    Labs:  Basename 12/01/11 0400 11/30/11 0413 11/29/11 0435  HGB 11.3* 11.5* 11.7*    Basename 12/01/11 0400 11/30/11 0413  WBC 11.7* 11.6*  RBC 3.79* 3.85*  HCT 33.3* 33.9*  PLT 179 183    Basename 11/30/11 0413 11/29/11 0435  NA 135 135  K 3.3* 3.8  CL 99 100  CO2 27 28  BUN 13 17  CREATININE 0.96 0.85  GLUCOSE 135* 134*  CALCIUM 8.5 8.6   No results found for this basename: LABPT:2,INR:2 in the last 72 hours  EXAM General - Patient is Alert, Appropriate and Oriented Extremity - Neurologically intact Neurovascular intact Incision: dressing C/D/I No cellulitis present Compartment soft Dressing/Incision - clean, dry, no drainage Motor Function - intact, moving foot and toes well on exam.   Past Medical History  Diagnosis Date  . Fibromyalgia   . Anginal pain     xh of 4 years ago   . Hypertension   . Dysrhythmia   . Anxiety   . Depression   . Peripheral vascular disease   . Headache     occasional   . Arthritis   . Anemia     hx of     Assessment/Plan: 3 Days Post-Op Procedure(s) (LRB): TOTAL HIP ARTHROPLASTY (Right) Principal Problem:  *OA (osteoarthritis) of hip   Up with therapy Discharge home with home health  DVT Prophylaxis - Xarelto Weight Bearing As  Tolerated right Leg  Nazifa Trinka V 12/01/2011, 6:51 AM

## 2011-12-01 NOTE — Progress Notes (Signed)
Occupational Therapy Note Chart reviewed. Spoke with pt and she states she feels comfortable with AE use from yesterday's OT session and declines need to practice shower transfer. She did stairs with PT and per PT report did well. Verbally reviewed technique with pt for shower transfer. Pt states she is ready for discharge home. Judithann Sauger OTR/L [phone] 12/01/2011

## 2011-12-01 NOTE — Progress Notes (Signed)
Physical Therapy Treatment Patient Details Name: Ashley Hoover MRN: [id number] DOB: [birth year] Today's Date: 12/01/2011 Time: 9147-8295 PT Time Calculation (min): 32 min  PT Assessment / Plan / Recommendation Comments on Treatment Session  Pt continues to do very well with all mobility tasks and states that she doesn't feel the need to practice stairs again.  Ready for D/C.     Follow Up Recommendations  Home health PT    Barriers to Discharge        Equipment Recommendations  None recommended by PT;None recommended by OT    Recommendations for Other Services    Frequency 7X/week   Plan Discharge plan remains appropriate    Precautions / Restrictions Precautions Precautions: Posterior Hip Precaution Comments: Pt able to recall 3/3 precautions Restrictions Weight Bearing Restrictions: No Other Position/Activity Restrictions: WBAT   Pertinent Vitals/Pain 2/10    Mobility  Bed Mobility Bed Mobility: Not assessed (Pt was already in recliner when PT arrived. ) Transfers Transfers: Sit to Stand;Stand to Sit Sit to Stand: 6: Modified independent (Device/Increase time) Stand to Sit: 6: Modified independent (Device/Increase time) Details for Transfer Assistance: increased time Ambulation/Gait Ambulation/Gait Assistance: 6: Modified independent (Device/Increase time) Ambulation Distance (Feet): 200 Feet Assistive device: Rolling walker Gait Pattern: Step-to pattern;Decreased stance time - right;Decreased step length - left;Trunk flexed Gait velocity: decreased Stairs: No    Exercises Total Joint Exercises Ankle Circles/Pumps: AROM;Both;20 reps Quad Sets: AROM;Strengthening;Both;5 reps Gluteal Sets: Strengthening;Both;5 reps Short Arc Quad: AROM;Right;5 reps Heel Slides: AAROM;Right;5 reps Hip ABduction/ADduction: AAROM;Right;5 reps   PT Diagnosis:    PT Problem List:   PT Treatment Interventions:     PT Goals Acute Rehab PT Goals PT Goal Formulation: With  patient Time For Goal Achievement: 12/02/11 Potential to Achieve Goals: Good Pt will go Sit to Stand: with supervision PT Goal: Sit to Stand - Progress: Met Pt will Ambulate: 51 - 150 feet;with supervision;with least restrictive assistive device PT Goal: Ambulate - Progress: Met Pt will Go Up / Down Stairs: 3-5 stairs;with min assist;with least restrictive assistive device PT Goal: Up/Down Stairs - Progress: Met  Visit Information  Last PT Received On: 12/01/11 Assistance Needed: +1    Subjective Data  Subjective: I'm ready to go home.  Patient Stated Goal: to get home   Cognition  Overall Cognitive Status: Appears within functional limits for tasks assessed/performed Arousal/Alertness: Awake/alert Orientation Level: Appears intact for tasks assessed Behavior During Session: [facility] for tasks performed    Balance     End of Session PT - End of Session Activity Tolerance: Patient tolerated treatment well Patient left: in chair;with call bell/phone within reach Nurse Communication: Mobility status   GP     Page, Meribeth Mattes 12/01/2011, 9:32 AM

## 2011-12-07 NOTE — Discharge Summary (Signed)
Physician Discharge Summary   Patient ID: Ashley Hoover MRN: [id number] DOB/AGE: [birth year] 61 y.o.  Admit date: 11/28/2011 Discharge date: 12/01/2011  Primary Diagnosis: Osteoarthritis Right hip   Admission Diagnoses:  Past Medical History  Diagnosis Date  . Fibromyalgia   . Anginal pain     xh of 4 years ago   . Hypertension   . Dysrhythmia   . Anxiety   . Depression   . Peripheral vascular disease   . Headache     occasional   . Arthritis   . Anemia     hx of    Discharge Diagnoses:   Principal Problem:  *OA (osteoarthritis) of hip  Procedure: Procedure(s) (LRB): TOTAL HIP ARTHROPLASTY (Right)   Consults: None  HPI: Ashley Hoover is a 61 y.o. female with end stage arthritis of her right hip with progressively worsening pain and dysfunction. Pain occurs with activity and rest including pain at night. She has tried analgesics, protected weight bearing and rest without benefit. Pain is too severe to attempt physical therapy. Radiographs demonstrate bone on bone arthritis with subchondral cyst formation. She presents now for right THA.  Laboratory Data: Hospital Outpatient Visit on 11/24/2011  Component Date Value Range Status  . MRSA, PCR 11/24/2011 NEGATIVE  NEGATIVE Final  . Staphylococcus aureus 11/24/2011 POSITIVE* NEGATIVE Final   Comment:                                 The Xpert SA Assay (FDA                          approved for NASAL specimens                          only), is one component of                          a comprehensive surveillance                          program.  It is not intended                          to diagnose infection nor to                          guide or monitor treatment.  Marland Kitchen aPTT 11/24/2011 31  24 - 37 seconds Final  . WBC 11/24/2011 6.9  4.0 - 10.5 K/uL Final  . RBC 11/24/2011 5.02  3.87 - 5.11 MIL/uL Final  . Hemoglobin 11/24/2011 14.9  12.0 - 15.0 g/dL Final  . HCT 11/91/4782 44.0  36.0 - 46.0 % Final  . MCV  11/24/2011 87.6  78.0 - 100.0 fL Final  . MCH 11/24/2011 29.7  26.0 - 34.0 pg Final  . MCHC 11/24/2011 33.9  30.0 - 36.0 g/dL Final  . RDW 95/62/1308 13.4  11.5 - 15.5 % Final  . Platelets 11/24/2011 220  150 - 400 K/uL Final  . Sodium 11/24/2011 141  135 - 145 mEq/L Final  . Potassium 11/24/2011 3.7  3.5 - 5.1 mEq/L Final  . Chloride 11/24/2011 102  96 - 112 mEq/L Final  . CO2 11/24/2011 28  19 - 32 mEq/L Final  .  Glucose, Bld 11/24/2011 105* 70 - 99 mg/dL Final  . BUN 16/02/9603 22  6 - 23 mg/dL Final  . Creatinine, Ser 11/24/2011 1.02  0.50 - 1.10 mg/dL Final  . Calcium 54/01/8118 9.7  8.4 - 10.5 mg/dL Final  . Total Protein 11/24/2011 7.6  6.0 - 8.3 g/dL Final  . Albumin 14/78/2956 4.3  3.5 - 5.2 g/dL Final  . AST 21/30/8657 24  0 - 37 U/L Final  . ALT 11/24/2011 21  0 - 35 U/L Final  . Alkaline Phosphatase 11/24/2011 67  39 - 117 U/L Final  . Total Bilirubin 11/24/2011 1.0  0.3 - 1.2 mg/dL Final  . GFR calc non Af Amer 11/24/2011 57* >90 mL/min Final  . GFR calc Af Amer 11/24/2011 66* >90 mL/min Final   Comment:                                 The eGFR has been calculated                          using the CKD EPI equation.                          This calculation has not been                          validated in all clinical                          situations.                          eGFR's persistently                          <90 mL/min signify                          possible Chronic Kidney Disease.  Marland Kitchen Prothrombin Time 11/24/2011 13.3  11.6 - 15.2 seconds Final  . INR 11/24/2011 0.99  0.00 - 1.49 Final  . Color, Urine 11/24/2011 YELLOW  YELLOW Final  . APPearance 11/24/2011 CLEAR  CLEAR Final  . Specific Gravity, Urine 11/24/2011 1.021  1.005 - 1.030 Final  . pH 11/24/2011 7.0  5.0 - 8.0 Final  . Glucose, UA 11/24/2011 NEGATIVE  NEGATIVE mg/dL Final  . Hgb urine dipstick 11/24/2011 SMALL* NEGATIVE Final  . Bilirubin Urine 11/24/2011 NEGATIVE  NEGATIVE Final  .  Ketones, ur 11/24/2011 NEGATIVE  NEGATIVE mg/dL Final  . Protein, ur 84/69/6295 NEGATIVE  NEGATIVE mg/dL Final  . Urobilinogen, UA 11/24/2011 0.2  0.0 - 1.0 mg/dL Final  . Nitrite 28/41/3244 NEGATIVE  NEGATIVE Final  . Leukocytes, UA 11/24/2011 NEGATIVE  NEGATIVE Final  . Squamous Epithelial / LPF 11/24/2011 RARE  RARE Final  . WBC, UA 11/24/2011 0-2  <3 WBC/hpf Final  . RBC / HPF 11/24/2011 3-6  <3 RBC/hpf Final  . Bacteria, UA 11/24/2011 FEW* RARE Final  . Daryll Drown 11/24/2011 MUCOUS PRESENT   Final   No results found for this basename: HGB:5 in the last 72 hours No results found for this basename: WBC:2,RBC:2,HCT:2,PLT:2 in the last 72 hours No results found for this basename: NA:2,K:2,CL:2,CO2:2,BUN:2,CREATININE:2,GLUCOSE:2,CALCIUM:2 in the last 72 hours  No results found for this basename: LABPT:2,INR:2 in the last 72 hours  X-Rays:Dg Chest 2 View  11/24/2011  *RADIOLOGY REPORT*  Clinical Data: Preop right hip replacement  CHEST - 2 VIEW  Comparison: 11/01/2008  Findings: Lungs are clear. No pleural effusion or pneumothorax.  Cardiomediastinal silhouette is within normal limits.  Degenerative changes of the visualized thoracolumbar spine.  IMPRESSION: No evidence of acute cardiopulmonary disease.  Original Report Authenticated By: Charline Bills, M.D.   Dg Hip Complete Right  11/24/2011  *RADIOLOGY REPORT*  Clinical Data: Preop right total hip replacement  RIGHT HIP - COMPLETE 2+ VIEW  Comparison: None.  Findings: Moderate to severe degenerative changes of the right hip.  Left hip joint space is preserved.  Degenerative changes of the lower lumbar spine.  No fracture or dislocation is seen.  IMPRESSION: Moderate to severe degenerative changes of the right hip.  Original Report Authenticated By: Charline Bills, M.D.   Dg Pelvis Portable  11/28/2011  *RADIOLOGY REPORT*  Clinical Data: Post right hip replacement.  PORTABLE PELVIS  Comparison: Portable view 11/28/2011.Preoperative exam  11/24/2011.  Findings: Post right hip replacement which appears in satisfactory position on this single projection without complication noted. Surgical drain is in place.  Left hip joint degenerative changes.  Degenerative changes lower lumbar spine.  IMPRESSION: Satisfactory position of right total hip prosthesis.  Original Report Authenticated By: Fuller Canada, M.D.   Dg Hip Portable 1 View Right  11/28/2011  *RADIOLOGY REPORT*  Clinical Data: Post right hip replacement.  PORTABLE RIGHT HIP - 1 VIEW  Comparison: 11/24/2011.  Findings: Frontal projection of total right hip prosthesis appears in satisfactory position without complication noted.  Surgical drain in place.  IMPRESSION: Post right hip arthroplasty without complication noted.  Original Report Authenticated By: Fuller Canada, M.D.    EKG: Orders placed during the hospital encounter of 11/28/11  . EKG     Hospital Course: Patient was admitted to [facility] and taken to the OR and underwent the above state procedure without complications.  Patient tolerated the procedure well and was later transferred to the recovery room and then to the orthopaedic floor for postoperative care.  They were given PO and IV analgesics for pain control following their surgery.  They were given 24 hours of postoperative antibiotics and started on DVT prophylaxis in the form of Xarelto.   PT and OT were ordered for total hip protocol.  The patient was allowed to be WBAT with therapy. Discharge planning was consulted to help with postop disposition and equipment needs.  Patient had a rough night on the evening of surgery but was better the next morning and started to get up OOB with therapy on day one.  Hemovac drain was pulled without difficulty.  The knee immobilizer was removed and discontinued.  Continued to work with therapy into day two.  Dressing was changed on day two and the incision was healing well.  By day three, the patient had progressed  with therapy and meeting their goals.  Incision was healing well.  Patient was seen in rounds and was ready to go home.  Discharge Medications: Prior to Admission medications   Medication Sig Start Date End Date Taking? Authorizing Provider  DULoxetine (CYMBALTA) 60 MG capsule Take 60 mg by mouth at bedtime.    Yes Historical Provider, MD  Polyethyl Glycol-Propyl Glycol 0.4-0.3 % SOLN Apply to eye as needed.   Yes Historical Provider, MD  polyethylene glycol (MIRALAX / GLYCOLAX) packet Take  17 g by mouth daily. Pt takes in am   Yes Historical Provider, MD  simvastatin (ZOCOR) 40 MG tablet Take 40 mg by mouth every evening.   Yes Historical Provider, MD  triamterene-hydrochlorothiazide (MAXZIDE) 75-50 MG per tablet Take 1 tablet by mouth daily with breakfast.   Yes Historical Provider, MD  diphenhydrAMINE (BENADRYL) 25 MG tablet Take 12.5 mg by mouth at bedtime as needed. For sleep    Historical Provider, MD  ferrous sulfate 325 (65 FE) MG tablet Take 325 mg by mouth daily with breakfast.    Historical Provider, MD  methocarbamol (ROBAXIN) 500 MG tablet Take 1 tablet (500 mg total) by mouth every 6 (six) hours as needed. 12/01/11 12/11/11  Alexzandrew Perkins, PA  oxyCODONE (OXY IR/ROXICODONE) 5 MG immediate release tablet Take 1-2 tablets (5-10 mg total) by mouth every 3 (three) hours as needed. 12/01/11 12/11/11  Alexzandrew Julien Girt, PA  rivaroxaban (XARELTO) 10 MG TABS tablet Take 1 tablet (10 mg total) by mouth daily with breakfast. Take Xarelto for two and a half more weeks, then discontinue Xarelto. Once the patient has completed the Xarelto, they may resume the 81 mg Aspirin. 12/01/11   Alexzandrew Julien Girt, PA  zolpidem (AMBIEN) 10 MG tablet Take 10 mg by mouth at bedtime as needed. For sleep    Historical Provider, MD    Diet: Cardiac diet Activity:WBAT No bending hip over 90 degrees- A "L" Angle Do not cross legs Do not let foot roll inward When turning these patients a pillow should be  placed between the patient's legs to prevent crossing. Patients should have the affected knee fully extended when trying to sit or stand from all surfaces to prevent excessive hip flexion. When ambulating and turning toward the affected side the affected leg should have the toes turned out prior to moving the walker and the rest of patient's body as to prevent internal rotation/ turning in of the leg. Abduction pillows are the most effective way to prevent a patient from not crossing legs or turning toes in at rest. If an abduction pillow is not ordered placing a regular pillow length wise between the patient's legs is also an effective reminder. It is imperative that these precautions be maintained so that the surgical hip does not dislocate. Follow-up:in 2 weeks Disposition - Home Discharged Condition: good   Discharge Orders    Future Orders Please Complete By Expires   Diet - low sodium heart healthy      Call MD / Call 911      Comments:   If you experience chest pain or shortness of breath, CALL 911 and be transported to the hospital emergency room.  If you develope a fever above 101 F, pus (white drainage) or increased drainage or redness at the wound, or calf pain, call your surgeon's office.   Discharge instructions      Comments:   Pick up stool softner and laxative for home. Do not submerge incision under water. May shower. Continue to use ice for pain and swelling from surgery. Hip precautions.  Total Hip Protocol.  Take Xarelto for two and a half more weeks, then discontinue Xarelto. Once the patient has completed the Xarelto, they may resume the 81 mg Aspirin.   Constipation Prevention      Comments:   Drink plenty of fluids.  Prune juice may be helpful.  You may use a stool softener, such as Colace (over the counter) 100 mg twice a day.  Use MiraLax (over the  counter) for constipation as needed.   Increase activity slowly as tolerated      Patient may shower       Comments:   You may shower without a dressing once there is no drainage.  Do not wash over the wound.  If drainage remains, do not shower until drainage stops.   Driving restrictions      Comments:   No driving until released by the physician.   Lifting restrictions      Comments:   No lifting until released by the physician.   Follow the hip precautions as taught in Physical Therapy      Change dressing      Comments:   You may change your dressing dressing daily with sterile 4 x 4 inch gauze dressing and paper tape.  Do not submerge the incision under water.   TED hose      Comments:   Use stockings (TED hose) for 3 weeks on both leg(s).  You may remove them at night for sleeping.   Do not sit on low chairs, stoools or toilet seats, as it may be difficult to get up from low surfaces        Medication List  As of 12/07/2011  9:14 AM   STOP taking these medications         ARTHROTEC 75-0.2 MG Tbec      aspirin 81 MG tablet      CALCIUM 600 + D PO      Melatonin 3 MG Caps      multivitamin with minerals tablet         TAKE these medications         diphenhydrAMINE 25 MG tablet   Commonly known as: BENADRYL   Take 12.5 mg by mouth at bedtime as needed. For sleep      DULoxetine 60 MG capsule   Commonly known as: CYMBALTA   Take 60 mg by mouth at bedtime.      ferrous sulfate 325 (65 FE) MG tablet   Take 325 mg by mouth daily with breakfast.      methocarbamol 500 MG tablet   Commonly known as: ROBAXIN   Take 1 tablet (500 mg total) by mouth every 6 (six) hours as needed.      oxyCODONE 5 MG immediate release tablet   Commonly known as: Oxy IR/ROXICODONE   Take 1-2 tablets (5-10 mg total) by mouth every 3 (three) hours as needed.      Polyethyl Glycol-Propyl Glycol 0.4-0.3 % Soln   Apply to eye as needed.      polyethylene glycol packet   Commonly known as: MIRALAX / GLYCOLAX   Take 17 g by mouth daily. Pt takes in am      rivaroxaban 10 MG Tabs tablet    Commonly known as: XARELTO   Take 1 tablet (10 mg total) by mouth daily with breakfast. Take Xarelto for two and a half more weeks, then discontinue Xarelto.  Once the patient has completed the Xarelto, they may resume the 81 mg Aspirin.      simvastatin 40 MG tablet   Commonly known as: ZOCOR   Take 40 mg by mouth every evening.      triamterene-hydrochlorothiazide 75-50 MG per tablet   Commonly known as: MAXZIDE   Take 1 tablet by mouth daily with breakfast.      zolpidem 10 MG tablet   Commonly known as: AMBIEN   Take 10 mg by mouth  at bedtime as needed. For sleep           Follow-up Information    Follow up with Loanne Drilling, MD. Schedule an appointment as soon as possible for a visit in 2 weeks.   Contact information:   [facility] [address] [place] [address] [phone]          Signed: Patrica Duel 12/07/2011, 9:14 AM

## 2013-06-15 DIAGNOSIS — Z1231 Encounter for screening mammogram for malignant neoplasm of breast: Secondary | ICD-10-CM | POA: Diagnosis not present

## 2013-06-15 DIAGNOSIS — Z130 Encounter for screening for diseases of the blood and blood-forming organs and certain disorders involving the immune mechanism: Secondary | ICD-10-CM | POA: Diagnosis not present

## 2013-06-15 DIAGNOSIS — Z124 Encounter for screening for malignant neoplasm of cervix: Secondary | ICD-10-CM | POA: Diagnosis not present

## 2013-06-15 DIAGNOSIS — Z1212 Encounter for screening for malignant neoplasm of rectum: Secondary | ICD-10-CM | POA: Diagnosis not present

## 2013-06-15 DIAGNOSIS — Z01419 Encounter for gynecological examination (general) (routine) without abnormal findings: Secondary | ICD-10-CM | POA: Diagnosis not present

## 2013-06-17 ENCOUNTER — Ambulatory Visit: Payer: BC Managed Care – PPO | Admitting: Internal Medicine

## 2013-06-21 ENCOUNTER — Other Ambulatory Visit: Payer: Self-pay | Admitting: Obstetrics and Gynecology

## 2013-06-21 DIAGNOSIS — R928 Other abnormal and inconclusive findings on diagnostic imaging of breast: Secondary | ICD-10-CM

## 2013-07-04 ENCOUNTER — Ambulatory Visit
Admission: RE | Admit: 2013-07-04 | Discharge: 2013-07-04 | Disposition: A | Discharge status: Home / Self Care | Payer: Medicare Other | Source: Ambulatory Visit | Attending: Obstetrics and Gynecology | Admitting: Obstetrics and Gynecology

## 2013-07-04 DIAGNOSIS — R928 Other abnormal and inconclusive findings on diagnostic imaging of breast: Secondary | ICD-10-CM

## 2013-07-21 DIAGNOSIS — G4700 Insomnia, unspecified: Secondary | ICD-10-CM | POA: Diagnosis not present

## 2013-07-21 DIAGNOSIS — R5381 Other malaise: Secondary | ICD-10-CM | POA: Diagnosis not present

## 2013-07-21 DIAGNOSIS — M1990 Unspecified osteoarthritis, unspecified site: Secondary | ICD-10-CM | POA: Diagnosis not present

## 2013-07-21 DIAGNOSIS — I8390 Asymptomatic varicose veins of unspecified lower extremity: Secondary | ICD-10-CM | POA: Diagnosis not present

## 2013-07-21 DIAGNOSIS — H04129 Dry eye syndrome of unspecified lacrimal gland: Secondary | ICD-10-CM | POA: Diagnosis not present

## 2013-07-21 DIAGNOSIS — Z79899 Other long term (current) drug therapy: Secondary | ICD-10-CM | POA: Diagnosis not present

## 2013-07-21 DIAGNOSIS — I4949 Other premature depolarization: Secondary | ICD-10-CM | POA: Diagnosis not present

## 2013-07-21 DIAGNOSIS — R51 Headache: Secondary | ICD-10-CM | POA: Diagnosis not present

## 2013-07-21 DIAGNOSIS — F411 Generalized anxiety disorder: Secondary | ICD-10-CM | POA: Diagnosis not present

## 2013-07-21 DIAGNOSIS — I10 Essential (primary) hypertension: Secondary | ICD-10-CM | POA: Diagnosis not present

## 2013-08-15 DIAGNOSIS — IMO0001 Reserved for inherently not codable concepts without codable children: Secondary | ICD-10-CM | POA: Diagnosis not present

## 2013-08-15 DIAGNOSIS — M19049 Primary osteoarthritis, unspecified hand: Secondary | ICD-10-CM | POA: Diagnosis not present

## 2013-08-15 DIAGNOSIS — M1990 Unspecified osteoarthritis, unspecified site: Secondary | ICD-10-CM | POA: Diagnosis not present

## 2013-08-15 DIAGNOSIS — M19079 Primary osteoarthritis, unspecified ankle and foot: Secondary | ICD-10-CM | POA: Diagnosis not present

## 2013-08-15 DIAGNOSIS — R6889 Other general symptoms and signs: Secondary | ICD-10-CM | POA: Diagnosis not present

## 2013-08-15 DIAGNOSIS — M2550 Pain in unspecified joint: Secondary | ICD-10-CM | POA: Diagnosis not present

## 2013-08-15 DIAGNOSIS — R5381 Other malaise: Secondary | ICD-10-CM | POA: Diagnosis not present

## 2013-09-05 DIAGNOSIS — M3500 Sicca syndrome, unspecified: Secondary | ICD-10-CM | POA: Diagnosis not present

## 2013-09-05 DIAGNOSIS — M2550 Pain in unspecified joint: Secondary | ICD-10-CM | POA: Diagnosis not present

## 2013-09-05 DIAGNOSIS — M1990 Unspecified osteoarthritis, unspecified site: Secondary | ICD-10-CM | POA: Diagnosis not present

## 2013-09-05 DIAGNOSIS — IMO0001 Reserved for inherently not codable concepts without codable children: Secondary | ICD-10-CM | POA: Diagnosis not present

## 2013-09-26 DIAGNOSIS — M1990 Unspecified osteoarthritis, unspecified site: Secondary | ICD-10-CM | POA: Diagnosis not present

## 2013-09-26 DIAGNOSIS — M2550 Pain in unspecified joint: Secondary | ICD-10-CM | POA: Diagnosis not present

## 2013-09-26 DIAGNOSIS — M3500 Sicca syndrome, unspecified: Secondary | ICD-10-CM | POA: Diagnosis not present

## 2013-09-26 DIAGNOSIS — M069 Rheumatoid arthritis, unspecified: Secondary | ICD-10-CM | POA: Diagnosis not present

## 2013-10-05 DIAGNOSIS — M3500 Sicca syndrome, unspecified: Secondary | ICD-10-CM | POA: Diagnosis not present

## 2013-10-05 DIAGNOSIS — D3130 Benign neoplasm of unspecified choroid: Secondary | ICD-10-CM | POA: Diagnosis not present

## 2013-10-05 DIAGNOSIS — H04129 Dry eye syndrome of unspecified lacrimal gland: Secondary | ICD-10-CM | POA: Diagnosis not present

## 2013-11-03 DIAGNOSIS — Z79899 Other long term (current) drug therapy: Secondary | ICD-10-CM | POA: Diagnosis not present

## 2013-11-29 DIAGNOSIS — M3500 Sicca syndrome, unspecified: Secondary | ICD-10-CM | POA: Diagnosis not present

## 2013-11-29 DIAGNOSIS — IMO0001 Reserved for inherently not codable concepts without codable children: Secondary | ICD-10-CM | POA: Diagnosis not present

## 2013-11-29 DIAGNOSIS — M069 Rheumatoid arthritis, unspecified: Secondary | ICD-10-CM | POA: Diagnosis not present

## 2013-11-29 DIAGNOSIS — Z79899 Other long term (current) drug therapy: Secondary | ICD-10-CM | POA: Diagnosis not present

## 2013-11-29 DIAGNOSIS — M1990 Unspecified osteoarthritis, unspecified site: Secondary | ICD-10-CM | POA: Diagnosis not present

## 2014-01-03 DIAGNOSIS — F988 Other specified behavioral and emotional disorders with onset usually occurring in childhood and adolescence: Secondary | ICD-10-CM | POA: Diagnosis not present

## 2014-01-03 DIAGNOSIS — M3500 Sicca syndrome, unspecified: Secondary | ICD-10-CM | POA: Diagnosis not present

## 2014-01-03 DIAGNOSIS — F411 Generalized anxiety disorder: Secondary | ICD-10-CM | POA: Diagnosis not present

## 2014-01-03 DIAGNOSIS — IMO0001 Reserved for inherently not codable concepts without codable children: Secondary | ICD-10-CM | POA: Diagnosis not present

## 2014-01-03 DIAGNOSIS — M069 Rheumatoid arthritis, unspecified: Secondary | ICD-10-CM | POA: Diagnosis not present

## 2014-01-05 ENCOUNTER — Telehealth: Payer: Self-pay | Admitting: Cardiovascular Disease

## 2014-01-09 NOTE — Telephone Encounter (Signed)
Closed enounter °

## 2014-01-24 ENCOUNTER — Other Ambulatory Visit: Payer: Self-pay | Admitting: Internal Medicine

## 2014-01-24 DIAGNOSIS — I10 Essential (primary) hypertension: Secondary | ICD-10-CM | POA: Diagnosis not present

## 2014-01-24 DIAGNOSIS — R7989 Other specified abnormal findings of blood chemistry: Secondary | ICD-10-CM

## 2014-01-24 DIAGNOSIS — I2510 Atherosclerotic heart disease of native coronary artery without angina pectoris: Secondary | ICD-10-CM | POA: Diagnosis not present

## 2014-01-24 DIAGNOSIS — R945 Abnormal results of liver function studies: Principal | ICD-10-CM

## 2014-01-27 DIAGNOSIS — M3500 Sicca syndrome, unspecified: Secondary | ICD-10-CM | POA: Diagnosis not present

## 2014-01-27 DIAGNOSIS — I4949 Other premature depolarization: Secondary | ICD-10-CM | POA: Diagnosis not present

## 2014-01-27 DIAGNOSIS — Z0000 Encounter for general adult medical examination without abnormal findings: Secondary | ICD-10-CM | POA: Diagnosis not present

## 2014-01-27 DIAGNOSIS — I8390 Asymptomatic varicose veins of unspecified lower extremity: Secondary | ICD-10-CM | POA: Diagnosis not present

## 2014-01-27 DIAGNOSIS — M069 Rheumatoid arthritis, unspecified: Secondary | ICD-10-CM | POA: Diagnosis not present

## 2014-01-27 DIAGNOSIS — F988 Other specified behavioral and emotional disorders with onset usually occurring in childhood and adolescence: Secondary | ICD-10-CM | POA: Diagnosis not present

## 2014-01-27 DIAGNOSIS — K5900 Constipation, unspecified: Secondary | ICD-10-CM | POA: Diagnosis not present

## 2014-01-27 DIAGNOSIS — R945 Abnormal results of liver function studies: Secondary | ICD-10-CM | POA: Diagnosis not present

## 2014-01-30 DIAGNOSIS — Z1212 Encounter for screening for malignant neoplasm of rectum: Secondary | ICD-10-CM | POA: Diagnosis not present

## 2014-02-02 ENCOUNTER — Other Ambulatory Visit: Payer: Self-pay | Admitting: Internal Medicine

## 2014-02-02 ENCOUNTER — Ambulatory Visit
Admission: RE | Admit: 2014-02-02 | Discharge: 2014-02-02 | Disposition: A | Discharge status: Home / Self Care | Payer: Medicare Other | Source: Ambulatory Visit | Attending: Internal Medicine | Admitting: Internal Medicine

## 2014-02-02 DIAGNOSIS — R748 Abnormal levels of other serum enzymes: Secondary | ICD-10-CM | POA: Diagnosis not present

## 2014-02-02 DIAGNOSIS — R945 Abnormal results of liver function studies: Principal | ICD-10-CM

## 2014-02-02 DIAGNOSIS — R7989 Other specified abnormal findings of blood chemistry: Secondary | ICD-10-CM

## 2014-02-08 ENCOUNTER — Encounter: Payer: Self-pay | Admitting: Cardiovascular Disease

## 2014-02-08 ENCOUNTER — Ambulatory Visit (INDEPENDENT_AMBULATORY_CARE_PROVIDER_SITE_OTHER): Payer: Medicare Other | Admitting: Cardiovascular Disease

## 2014-02-08 VITALS — BP 140/82 | HR 73 | Resp 16 | Ht 64.0 in | Wt 179.4 lb

## 2014-02-08 DIAGNOSIS — M069 Rheumatoid arthritis, unspecified: Secondary | ICD-10-CM | POA: Insufficient documentation

## 2014-02-08 DIAGNOSIS — I209 Angina pectoris, unspecified: Secondary | ICD-10-CM | POA: Diagnosis not present

## 2014-02-08 DIAGNOSIS — Z79899 Other long term (current) drug therapy: Secondary | ICD-10-CM | POA: Diagnosis not present

## 2014-02-08 DIAGNOSIS — E782 Mixed hyperlipidemia: Secondary | ICD-10-CM | POA: Diagnosis not present

## 2014-02-08 DIAGNOSIS — I2510 Atherosclerotic heart disease of native coronary artery without angina pectoris: Secondary | ICD-10-CM | POA: Insufficient documentation

## 2014-02-08 DIAGNOSIS — R079 Chest pain, unspecified: Secondary | ICD-10-CM | POA: Diagnosis not present

## 2014-02-08 DIAGNOSIS — E7800 Pure hypercholesterolemia, unspecified: Secondary | ICD-10-CM | POA: Insufficient documentation

## 2014-02-08 DIAGNOSIS — I10 Essential (primary) hypertension: Secondary | ICD-10-CM

## 2014-02-08 DIAGNOSIS — I25111 Atherosclerotic heart disease of native coronary artery with angina pectoris with documented spasm: Secondary | ICD-10-CM

## 2014-02-08 DIAGNOSIS — R7989 Other specified abnormal findings of blood chemistry: Secondary | ICD-10-CM

## 2014-02-08 DIAGNOSIS — R945 Abnormal results of liver function studies: Secondary | ICD-10-CM

## 2014-02-08 NOTE — Patient Instructions (Signed)
STOP Simvastatin.  Your physician recommends that you return for lab work in: January 2016 - FASTING at [facility] lab.  You do not need an appointment.  Dr. Sallyanne Kuster recommends that you schedule a follow-up appointment in: One year.

## 2014-02-08 NOTE — Progress Notes (Signed)
Patient ID: Zeb Comfort, female   DOB: [birth year], 63 y.o.   MRN: [id number]      Reason for office visit Coronary atherosclerosis, hyperlipidemia, hypertension  Daine is now 63 years old and returns in followup for mild coronary atherosclerosis and risk factors. She had cardiac catheterization in 2008 by Dr. Melvern Banker which showed a 40% lesion in the proximal right coronary artery with associated vasospasm. She had an abnormal ECG stress test. Is taking simvastatin for many years without side effects. She has mild hypertension treated with a combination diuretic. Chest pain has not been a problem for years. She has had asymptomatic PVCs.  She has a history of fibromyalgia, degenerative joint disease status post bilateral knee replacement and right hip replacement but has recently also been diagnosed with rheumatoid arthritis and started treatment with methotrexate. The methotrexate has recently been stopped after laboratory tests on September 15 and repeated on September 24 showed elevations in transaminases roughly to 4 times upper limit of normal. The plan is for her to start Humira   Allergies  Allergen Reactions  . Penicillins Hives  . Restasis [Cyclosporine] Swelling    Eye lids puffy    Current Outpatient Prescriptions  Medication Sig Dispense Refill  . acetaminophen (TYLENOL) 500 MG tablet Take 500 mg by mouth every 6 (six) hours as needed.      . ALPRAZolam (XANAX) 0.25 MG tablet Take 0.25 mg by mouth at bedtime as needed for anxiety.      Marland Kitchen aspirin 81 MG tablet Take 81 mg by mouth daily.      . cyclobenzaprine (FLEXERIL) 10 MG tablet Take 10 mg by mouth 3 (three) times daily as needed for muscle spasms.      . DULoxetine (CYMBALTA) 60 MG capsule Take 60 mg by mouth at bedtime.       . Melatonin 3 MG CAPS Take 1 capsule by mouth daily.      Vladimir Faster Glycol-Propyl Glycol 0.4-0.3 % SOLN Apply to eye as needed.      . polyethylene glycol (MIRALAX / GLYCOLAX) packet Take 17 g  by mouth daily. Pt takes in am      . triamterene-hydrochlorothiazide (MAXZIDE) 75-50 MG per tablet Take 1 tablet by mouth daily with breakfast.      . zolpidem (AMBIEN) 10 MG tablet Take 10 mg by mouth at bedtime as needed. For sleep       No current facility-administered medications for this visit.    Past Medical History  Diagnosis Date  . Fibromyalgia   . Anginal pain     xh of 4 years ago   . Hypertension   . Dysrhythmia   . Anxiety   . Depression   . Peripheral vascular disease   . Headache(784.0)     occasional   . Arthritis   . Anemia     hx of     Past Surgical History  Procedure Laterality Date  . Appendectomy      age 45  . Carpal tunnel release      right   . Joint replacement      bilateral knee replacements   . Vein ligation and stripping      age 70   . Total hip arthroplasty  11/28/2011    Procedure: TOTAL HIP ARTHROPLASTY;  Surgeon: Gearlean Alf, MD;  Location: [facility]S;  Service: Orthopedics;  Laterality: Right;    No family history on file.  History   Social History  . Marital  Status: Married    Spouse Name: N/A    Number of Children: N/A  . Years of Education: N/A   Occupational History  . Not on file.   Social History Main Topics  . Smoking status: Never Smoker   . Smokeless tobacco: Never Used  . Alcohol Use: 0.6 oz/week    1 Glasses of wine per week  . Drug Use: No  . Sexual Activity:    Other Topics Concern  . Not on file   Social History Narrative  . No narrative on file    Review of systems:  Complaints are mostly musculoskeletal with a variety of aches and pains and numbness in her left fifth finger consistent with ulnar nerve entrapment. He has swelling and stiffness of the first and second metacarpophalangeal joints bilaterally. The patient specifically denies any chest pain at rest or with exertion, dyspnea at rest or with exertion, orthopnea, paroxysmal nocturnal dyspnea, syncope, palpitations, focal neurological  deficits, intermittent claudication, lower extremity edema, unexplained weight gain, cough, hemoptysis or wheezing.  The patient also denies abdominal pain, nausea, vomiting, dysphagia, diarrhea, constipation, polyuria, polydipsia, dysuria, hematuria, frequency, urgency, abnormal bleeding or bruising, fever, chills, unexpected weight changes, mood swings, change in skin or hair texture, change in voice quality, auditory or visual problems, allergic reactions or rashes.  PHYSICAL EXAM BP 140/82  Pulse 73  Ht 5\' 4"  (1.626 m)  Wt 81.375 kg (179 lb 6.4 oz)  BMI 30.78 kg/m2  General: Alert, oriented x3, no distress Head: no evidence of trauma, PERRL, EOMI, no exophtalmos or lid lag, no myxedema, no xanthelasma; normal ears, nose and oropharynx Neck: normal jugular venous pulsations and no hepatojugular reflux; brisk carotid pulses without delay and no carotid bruits Chest: clear to auscultation, no signs of consolidation by percussion or palpation, normal fremitus, symmetrical and full respiratory excursions Cardiovascular: normal position and quality of the apical impulse, regular rhythm, normal first and second heart sounds, no murmurs, rubs or gallops Abdomen: no tenderness or distention, no masses by palpation, no abnormal pulsatility or arterial bruits, normal bowel sounds, no hepatosplenomegaly Extremities: no clubbing, cyanosis or edema; 2+ radial, ulnar and brachial pulses bilaterally; 2+ right femoral, posterior tibial and dorsalis pedis pulses; 2+ left femoral, posterior tibial and dorsalis pedis pulses; no subclavian or femoral bruits mild degenerative changes in the distal interphalangeal joints, but also has some redness and swelling in the first and second metacarpophalangeal joints bilaterally appear Neurological: grossly nonfocal   EKG: Normal sinus rhythm with QS pattern in leads V1 and V2, unchanged from previous tracings, no acute repolarization abnormalities  Lipid Panel  No  results found for this basename: chol, trig, hdl, cholhdl, vldl, ldlcalc, ldldirect    BMET    Component Value Date/Time   NA 135 11/30/2011 0413   K 3.3* 11/30/2011 0413   CL 99 11/30/2011 0413   CO2 27 11/30/2011 0413   GLUCOSE 135* 11/30/2011 0413   BUN 13 11/30/2011 0413   CREATININE 0.96 11/30/2011 0413   CALCIUM 8.5 11/30/2011 0413   GFRNONAA 62* 11/30/2011 0413   GFRAA 71* 11/30/2011 0413     ASSESSMENT AND PLAN  Dortha requires continued risk factor modification for mild/early coronary artery disease that is currently asymptomatic. I have asked her to stop taking simvastatin until her transaminases have been documented to be completely normal.   In fact at her request, we will recheck her lipid profile after she has been off statin therapy for about 3 months, before deciding what is  the most appropriate agent for her to continue taking long-term. In the long run, a brief interruption in statin therapy would have little impact on her coronary status. It is probably safer to discontinue any hepatotoxic drugs for the time being.  She has not required nitroglycerin for coronary spasm in her PVCs do not bother her. Her blood pressure is very well treated. At home her systolic blood pressures usually in the 120s or 130s.  Orders Placed This Encounter  Procedures  . Comprehensive metabolic panel  . Lipid panel  . EKG 12-Lead   Meds ordered this encounter  Medications  . Melatonin 3 MG CAPS    Sig: Take 1 capsule by mouth daily.  Marland Kitchen aspirin 81 MG tablet    Sig: Take 81 mg by mouth daily.  . cyclobenzaprine (FLEXERIL) 10 MG tablet    Sig: Take 10 mg by mouth 3 (three) times daily as needed for muscle spasms.  . ALPRAZolam (XANAX) 0.25 MG tablet    Sig: Take 0.25 mg by mouth at bedtime as needed for anxiety.  Marland Kitchen acetaminophen (TYLENOL) 500 MG tablet    Sig: Take 500 mg by mouth every 6 (six) hours as needed.    Holli Humbles, MD, [place] [phone] office [phone] pager

## 2014-02-20 DIAGNOSIS — R7989 Other specified abnormal findings of blood chemistry: Secondary | ICD-10-CM | POA: Diagnosis not present

## 2014-03-07 DIAGNOSIS — M159 Polyosteoarthritis, unspecified: Secondary | ICD-10-CM | POA: Diagnosis not present

## 2014-03-07 DIAGNOSIS — M0609 Rheumatoid arthritis without rheumatoid factor, multiple sites: Secondary | ICD-10-CM | POA: Diagnosis not present

## 2014-03-07 DIAGNOSIS — M3500 Sicca syndrome, unspecified: Secondary | ICD-10-CM | POA: Diagnosis not present

## 2014-03-07 DIAGNOSIS — M2550 Pain in unspecified joint: Secondary | ICD-10-CM | POA: Diagnosis not present

## 2014-03-23 DIAGNOSIS — Z23 Encounter for immunization: Secondary | ICD-10-CM | POA: Diagnosis not present

## 2014-04-25 DIAGNOSIS — M069 Rheumatoid arthritis, unspecified: Secondary | ICD-10-CM | POA: Diagnosis not present

## 2014-04-25 DIAGNOSIS — Z79899 Other long term (current) drug therapy: Secondary | ICD-10-CM | POA: Diagnosis not present

## 2014-04-25 DIAGNOSIS — H04123 Dry eye syndrome of bilateral lacrimal glands: Secondary | ICD-10-CM | POA: Diagnosis not present

## 2014-04-25 DIAGNOSIS — H2513 Age-related nuclear cataract, bilateral: Secondary | ICD-10-CM | POA: Diagnosis not present

## 2014-04-27 DIAGNOSIS — M0600 Rheumatoid arthritis without rheumatoid factor, unspecified site: Secondary | ICD-10-CM | POA: Diagnosis not present

## 2014-05-11 DIAGNOSIS — M159 Polyosteoarthritis, unspecified: Secondary | ICD-10-CM | POA: Diagnosis not present

## 2014-05-11 DIAGNOSIS — M3500 Sicca syndrome, unspecified: Secondary | ICD-10-CM | POA: Diagnosis not present

## 2014-05-11 DIAGNOSIS — M2550 Pain in unspecified joint: Secondary | ICD-10-CM | POA: Diagnosis not present

## 2014-05-11 DIAGNOSIS — M0609 Rheumatoid arthritis without rheumatoid factor, multiple sites: Secondary | ICD-10-CM | POA: Diagnosis not present

## 2014-05-15 DIAGNOSIS — L281 Prurigo nodularis: Secondary | ICD-10-CM | POA: Diagnosis not present

## 2014-05-15 DIAGNOSIS — L989 Disorder of the skin and subcutaneous tissue, unspecified: Secondary | ICD-10-CM | POA: Diagnosis not present

## 2014-05-15 DIAGNOSIS — D485 Neoplasm of uncertain behavior of skin: Secondary | ICD-10-CM | POA: Diagnosis not present

## 2014-06-09 DIAGNOSIS — M0609 Rheumatoid arthritis without rheumatoid factor, multiple sites: Secondary | ICD-10-CM | POA: Diagnosis not present

## 2014-06-20 DIAGNOSIS — L089 Local infection of the skin and subcutaneous tissue, unspecified: Secondary | ICD-10-CM | POA: Diagnosis not present

## 2014-06-20 DIAGNOSIS — D485 Neoplasm of uncertain behavior of skin: Secondary | ICD-10-CM | POA: Diagnosis not present

## 2014-07-27 DIAGNOSIS — F419 Anxiety disorder, unspecified: Secondary | ICD-10-CM | POA: Diagnosis not present

## 2014-07-27 DIAGNOSIS — M3500 Sicca syndrome, unspecified: Secondary | ICD-10-CM | POA: Diagnosis not present

## 2014-07-27 DIAGNOSIS — Z6830 Body mass index (BMI) 30.0-30.9, adult: Secondary | ICD-10-CM | POA: Diagnosis not present

## 2014-07-27 DIAGNOSIS — Z01419 Encounter for gynecological examination (general) (routine) without abnormal findings: Secondary | ICD-10-CM | POA: Diagnosis not present

## 2014-07-27 DIAGNOSIS — M797 Fibromyalgia: Secondary | ICD-10-CM | POA: Diagnosis not present

## 2014-07-27 DIAGNOSIS — E789 Disorder of lipoprotein metabolism, unspecified: Secondary | ICD-10-CM | POA: Diagnosis not present

## 2014-07-27 DIAGNOSIS — Z1389 Encounter for screening for other disorder: Secondary | ICD-10-CM | POA: Diagnosis not present

## 2014-07-27 DIAGNOSIS — I10 Essential (primary) hypertension: Secondary | ICD-10-CM | POA: Diagnosis not present

## 2014-07-27 DIAGNOSIS — M069 Rheumatoid arthritis, unspecified: Secondary | ICD-10-CM | POA: Diagnosis not present

## 2014-07-27 DIAGNOSIS — Z1231 Encounter for screening mammogram for malignant neoplasm of breast: Secondary | ICD-10-CM | POA: Diagnosis not present

## 2014-07-27 DIAGNOSIS — Z6831 Body mass index (BMI) 31.0-31.9, adult: Secondary | ICD-10-CM | POA: Diagnosis not present

## 2014-07-27 DIAGNOSIS — D229 Melanocytic nevi, unspecified: Secondary | ICD-10-CM | POA: Diagnosis not present

## 2014-07-27 DIAGNOSIS — I2510 Atherosclerotic heart disease of native coronary artery without angina pectoris: Secondary | ICD-10-CM | POA: Diagnosis not present

## 2014-07-27 DIAGNOSIS — G4700 Insomnia, unspecified: Secondary | ICD-10-CM | POA: Diagnosis not present

## 2014-08-01 ENCOUNTER — Encounter: Payer: Self-pay | Admitting: Cardiovascular Disease

## 2014-08-02 DIAGNOSIS — Z6839 Body mass index (BMI) 39.0-39.9, adult: Secondary | ICD-10-CM | POA: Diagnosis not present

## 2014-08-02 DIAGNOSIS — Z1211 Encounter for screening for malignant neoplasm of colon: Secondary | ICD-10-CM | POA: Diagnosis not present

## 2014-08-02 DIAGNOSIS — K5900 Constipation, unspecified: Secondary | ICD-10-CM | POA: Diagnosis not present

## 2014-08-03 DIAGNOSIS — M0609 Rheumatoid arthritis without rheumatoid factor, multiple sites: Secondary | ICD-10-CM | POA: Diagnosis not present

## 2014-08-10 DIAGNOSIS — M2550 Pain in unspecified joint: Secondary | ICD-10-CM | POA: Diagnosis not present

## 2014-08-10 DIAGNOSIS — M3500 Sicca syndrome, unspecified: Secondary | ICD-10-CM | POA: Diagnosis not present

## 2014-08-10 DIAGNOSIS — M0609 Rheumatoid arthritis without rheumatoid factor, multiple sites: Secondary | ICD-10-CM | POA: Diagnosis not present

## 2014-08-10 DIAGNOSIS — M797 Fibromyalgia: Secondary | ICD-10-CM | POA: Diagnosis not present

## 2014-08-28 DIAGNOSIS — K5901 Slow transit constipation: Secondary | ICD-10-CM | POA: Diagnosis not present

## 2014-08-28 DIAGNOSIS — Z1211 Encounter for screening for malignant neoplasm of colon: Secondary | ICD-10-CM | POA: Diagnosis not present

## 2014-08-28 DIAGNOSIS — R140 Abdominal distension (gaseous): Secondary | ICD-10-CM | POA: Diagnosis not present

## 2014-09-06 DIAGNOSIS — Z1211 Encounter for screening for malignant neoplasm of colon: Secondary | ICD-10-CM | POA: Diagnosis not present

## 2014-09-11 DIAGNOSIS — L718 Other rosacea: Secondary | ICD-10-CM | POA: Diagnosis not present

## 2014-10-03 DIAGNOSIS — M0589 Other rheumatoid arthritis with rheumatoid factor of multiple sites: Secondary | ICD-10-CM | POA: Diagnosis not present

## 2014-10-03 DIAGNOSIS — M0609 Rheumatoid arthritis without rheumatoid factor, multiple sites: Secondary | ICD-10-CM | POA: Diagnosis not present

## 2014-11-08 DIAGNOSIS — H0289 Other specified disorders of eyelid: Secondary | ICD-10-CM | POA: Diagnosis not present

## 2014-11-08 DIAGNOSIS — H04123 Dry eye syndrome of bilateral lacrimal glands: Secondary | ICD-10-CM | POA: Diagnosis not present

## 2014-11-23 ENCOUNTER — Encounter: Payer: Self-pay | Admitting: Cardiovascular Disease

## 2014-11-30 DIAGNOSIS — Z96653 Presence of artificial knee joint, bilateral: Secondary | ICD-10-CM | POA: Diagnosis not present

## 2014-11-30 DIAGNOSIS — Z96641 Presence of right artificial hip joint: Secondary | ICD-10-CM | POA: Diagnosis not present

## 2014-11-30 DIAGNOSIS — Z471 Aftercare following joint replacement surgery: Secondary | ICD-10-CM | POA: Diagnosis not present

## 2014-11-30 DIAGNOSIS — Z96652 Presence of left artificial knee joint: Secondary | ICD-10-CM | POA: Diagnosis not present

## 2014-11-30 DIAGNOSIS — Z96651 Presence of right artificial knee joint: Secondary | ICD-10-CM | POA: Diagnosis not present

## 2014-12-13 DIAGNOSIS — M797 Fibromyalgia: Secondary | ICD-10-CM | POA: Diagnosis not present

## 2014-12-13 DIAGNOSIS — M0609 Rheumatoid arthritis without rheumatoid factor, multiple sites: Secondary | ICD-10-CM | POA: Diagnosis not present

## 2014-12-13 DIAGNOSIS — M2550 Pain in unspecified joint: Secondary | ICD-10-CM | POA: Diagnosis not present

## 2014-12-13 DIAGNOSIS — Z79899 Other long term (current) drug therapy: Secondary | ICD-10-CM | POA: Diagnosis not present

## 2014-12-19 DIAGNOSIS — Z6831 Body mass index (BMI) 31.0-31.9, adult: Secondary | ICD-10-CM | POA: Diagnosis not present

## 2014-12-19 DIAGNOSIS — M3500 Sicca syndrome, unspecified: Secondary | ICD-10-CM | POA: Diagnosis not present

## 2014-12-19 DIAGNOSIS — J029 Acute pharyngitis, unspecified: Secondary | ICD-10-CM | POA: Diagnosis not present

## 2014-12-19 DIAGNOSIS — M797 Fibromyalgia: Secondary | ICD-10-CM | POA: Diagnosis not present

## 2014-12-19 DIAGNOSIS — M069 Rheumatoid arthritis, unspecified: Secondary | ICD-10-CM | POA: Diagnosis not present

## 2014-12-28 DIAGNOSIS — M0609 Rheumatoid arthritis without rheumatoid factor, multiple sites: Secondary | ICD-10-CM | POA: Diagnosis not present

## 2015-01-10 DIAGNOSIS — M1812 Unilateral primary osteoarthritis of first carpometacarpal joint, left hand: Secondary | ICD-10-CM | POA: Diagnosis not present

## 2015-01-10 DIAGNOSIS — M47812 Spondylosis without myelopathy or radiculopathy, cervical region: Secondary | ICD-10-CM | POA: Diagnosis not present

## 2015-01-10 DIAGNOSIS — G5601 Carpal tunnel syndrome, right upper limb: Secondary | ICD-10-CM | POA: Diagnosis not present

## 2015-01-10 DIAGNOSIS — M05841 Other rheumatoid arthritis with rheumatoid factor of right hand: Secondary | ICD-10-CM | POA: Diagnosis not present

## 2015-01-10 DIAGNOSIS — M05842 Other rheumatoid arthritis with rheumatoid factor of left hand: Secondary | ICD-10-CM | POA: Diagnosis not present

## 2015-01-10 DIAGNOSIS — M1811 Unilateral primary osteoarthritis of first carpometacarpal joint, right hand: Secondary | ICD-10-CM | POA: Diagnosis not present

## 2015-01-10 DIAGNOSIS — G5602 Carpal tunnel syndrome, left upper limb: Secondary | ICD-10-CM | POA: Diagnosis not present

## 2015-01-25 DIAGNOSIS — M0609 Rheumatoid arthritis without rheumatoid factor, multiple sites: Secondary | ICD-10-CM | POA: Diagnosis not present

## 2015-01-26 DIAGNOSIS — I2510 Atherosclerotic heart disease of native coronary artery without angina pectoris: Secondary | ICD-10-CM | POA: Diagnosis not present

## 2015-01-26 DIAGNOSIS — I10 Essential (primary) hypertension: Secondary | ICD-10-CM | POA: Diagnosis not present

## 2015-01-26 DIAGNOSIS — E789 Disorder of lipoprotein metabolism, unspecified: Secondary | ICD-10-CM | POA: Diagnosis not present

## 2015-01-26 DIAGNOSIS — Z0000 Encounter for general adult medical examination without abnormal findings: Secondary | ICD-10-CM | POA: Diagnosis not present

## 2015-01-31 DIAGNOSIS — M1811 Unilateral primary osteoarthritis of first carpometacarpal joint, right hand: Secondary | ICD-10-CM | POA: Diagnosis not present

## 2015-01-31 DIAGNOSIS — G5601 Carpal tunnel syndrome, right upper limb: Secondary | ICD-10-CM | POA: Diagnosis not present

## 2015-01-31 DIAGNOSIS — G5602 Carpal tunnel syndrome, left upper limb: Secondary | ICD-10-CM | POA: Diagnosis not present

## 2015-02-02 DIAGNOSIS — M069 Rheumatoid arthritis, unspecified: Secondary | ICD-10-CM | POA: Diagnosis not present

## 2015-02-02 DIAGNOSIS — Z23 Encounter for immunization: Secondary | ICD-10-CM | POA: Diagnosis not present

## 2015-02-02 DIAGNOSIS — R51 Headache: Secondary | ICD-10-CM | POA: Diagnosis not present

## 2015-02-02 DIAGNOSIS — Z0000 Encounter for general adult medical examination without abnormal findings: Secondary | ICD-10-CM | POA: Diagnosis not present

## 2015-02-02 DIAGNOSIS — I493 Ventricular premature depolarization: Secondary | ICD-10-CM | POA: Diagnosis not present

## 2015-02-02 DIAGNOSIS — G5600 Carpal tunnel syndrome, unspecified upper limb: Secondary | ICD-10-CM | POA: Diagnosis not present

## 2015-02-02 DIAGNOSIS — I8390 Asymptomatic varicose veins of unspecified lower extremity: Secondary | ICD-10-CM | POA: Diagnosis not present

## 2015-02-02 DIAGNOSIS — H9190 Unspecified hearing loss, unspecified ear: Secondary | ICD-10-CM | POA: Diagnosis not present

## 2015-02-02 DIAGNOSIS — M3500 Sicca syndrome, unspecified: Secondary | ICD-10-CM | POA: Diagnosis not present

## 2015-02-02 DIAGNOSIS — Z6831 Body mass index (BMI) 31.0-31.9, adult: Secondary | ICD-10-CM | POA: Diagnosis not present

## 2015-02-02 DIAGNOSIS — F900 Attention-deficit hyperactivity disorder, predominantly inattentive type: Secondary | ICD-10-CM | POA: Diagnosis not present

## 2015-02-21 DIAGNOSIS — M0609 Rheumatoid arthritis without rheumatoid factor, multiple sites: Secondary | ICD-10-CM | POA: Diagnosis not present

## 2015-02-21 DIAGNOSIS — Z79899 Other long term (current) drug therapy: Secondary | ICD-10-CM | POA: Diagnosis not present

## 2015-02-21 DIAGNOSIS — M2550 Pain in unspecified joint: Secondary | ICD-10-CM | POA: Diagnosis not present

## 2015-02-21 DIAGNOSIS — M797 Fibromyalgia: Secondary | ICD-10-CM | POA: Diagnosis not present

## 2015-03-06 DIAGNOSIS — Z1212 Encounter for screening for malignant neoplasm of rectum: Secondary | ICD-10-CM | POA: Diagnosis not present

## 2015-03-09 ENCOUNTER — Encounter: Payer: Self-pay | Admitting: Cardiovascular Disease

## 2015-03-09 ENCOUNTER — Ambulatory Visit (INDEPENDENT_AMBULATORY_CARE_PROVIDER_SITE_OTHER): Payer: Medicare Other | Admitting: Cardiovascular Disease

## 2015-03-09 VITALS — BP 122/72 | HR 82 | Resp 16 | Ht 64.0 in | Wt 188.0 lb

## 2015-03-09 DIAGNOSIS — I10 Essential (primary) hypertension: Secondary | ICD-10-CM | POA: Diagnosis not present

## 2015-03-09 DIAGNOSIS — I25111 Atherosclerotic heart disease of native coronary artery with angina pectoris with documented spasm: Secondary | ICD-10-CM | POA: Diagnosis not present

## 2015-03-09 DIAGNOSIS — E7800 Pure hypercholesterolemia, unspecified: Secondary | ICD-10-CM | POA: Diagnosis not present

## 2015-03-09 NOTE — Patient Instructions (Signed)
Dr. Croitoru recommends that you schedule a follow-up appointment in: ONE YEAR   

## 2015-03-09 NOTE — Progress Notes (Signed)
Patient ID: Ashley Hoover, female   DOB: January 12, 1951, 64 y.o.   MRN: [id number]     Cardiology Office Note   Date:  03/10/2015   ID:  Ashley Hoover, DOB Sep 20, 1950, MRN [id number]  PCP:  Precious Reel, MD  Cardiologist:   Sanda Klein, MD   No chief complaint on file.     History of Present Illness: Ashley Hoover is a 64 y.o. female who presents for Coronary atherosclerosis/vasospasm, hyperlipidemia, hypertension  Ashley Hoover is now 64 years old and returns in followup for mild coronary atherosclerosis and risk factors. She had cardiac catheterization in 2008 by Dr. Melvern Banker which showed a 40% lesion in the proximal right coronary artery with associated vasospasm. She had an a normal nuclear stress test in 2010. Taking simvastatin for many years without side effects, interrupted briefly in early 2016 for abnormal liver function tests, subsequently normalized.  LFT abnormality was more likely related to methotrexate treatment. She has mild hypertension treated with a combination diuretic. Chest pain has not been a problem for years. She has had frequent PVCs.  She has a history of fibromyalgia, degenerative joint disease status post bilateral knee replacement and right hip replacement but also diagnosed with rheumatoid arthritis.  She had labs performed in September with her primary care physician Dr. Virgina Jock and they were " okay". She states that Dr. Virgina Jock said her cholesterol is "the best it's ever been".   she describes occasional palpitations (a "thud" in her chest). She denies syncope, exertional or resting angina or dyspnea.   Past Medical History  Diagnosis Date  . Fibromyalgia   . Anginal pain ([place])     xh of 4 years ago   . Hypertension   . Dysrhythmia   . Anxiety   . Depression   . Peripheral vascular disease ([place])   . Headache(784.0)     occasional   . Arthritis   . Anemia     hx of     Past Surgical History  Procedure Laterality Date  . Appendectomy      age 45    . Carpal tunnel release      right   . Joint replacement      bilateral knee replacements   . Vein ligation and stripping      age 64   . Total hip arthroplasty  11/28/2011    Procedure: TOTAL HIP ARTHROPLASTY;  Surgeon: Gearlean Alf, MD;  Location: [facility];  Service: Orthopedics;  Laterality: Right;     Current Outpatient Prescriptions  Medication Sig Dispense Refill  . acetaminophen (TYLENOL) 500 MG tablet Take 500 mg by mouth every 6 (six) hours as needed (pain).     Marland Kitchen ALPRAZolam (XANAX) 0.25 MG tablet Take 0.25 mg by mouth at bedtime as needed for anxiety.    Marland Kitchen aspirin 81 MG tablet Take 81 mg by mouth daily.    . DULoxetine (CYMBALTA) 60 MG capsule Take 60 mg by mouth at bedtime.     . Golimumab (Hansen ARIA IV) Inject into the vein as directed.    . Melatonin 3 MG CAPS Take 1 capsule by mouth daily.    Vladimir Faster Glycol-Propyl Glycol 0.4-0.3 % SOLN Apply to eye as directed.     . polyethylene glycol (MIRALAX / GLYCOLAX) packet Take 17 g by mouth daily. Pt takes in am    . simvastatin (ZOCOR) 40 MG tablet Take 40 mg by mouth daily.    Marland Kitchen triamterene-hydrochlorothiazide (MAXZIDE) 75-50 MG per tablet Take  1 tablet by mouth daily with breakfast.    . zolpidem (AMBIEN) 10 MG tablet Take 10 mg by mouth at bedtime as needed. For sleep     No current facility-administered medications for this visit.    Allergies:   Methotrexate derivatives; Penicillins; and Restasis    Social History:  The patient  reports that she has never smoked. She has never used smokeless tobacco. She reports that she drinks about 0.6 oz of alcohol per week. She reports that she does not use illicit drugs.    ROS:  Please see the history of present illness.    Otherwise, review of systems positive for none.   All other systems are reviewed and negative.    PHYSICAL EXAM: VS:  BP 122/72 mmHg  Pulse 82  Resp 16  Ht 5\' 4"  (1.626 m)  Wt 188 lb (85.276 kg)  BMI 32.25 kg/m2 , BMI Body mass index is 32.25  kg/(m^2).  General: Alert, oriented x3, no distress Head: no evidence of trauma, PERRL, EOMI, no exophtalmos or lid lag, no myxedema, no xanthelasma; normal ears, nose and oropharynx Neck: normal jugular venous pulsations and no hepatojugular reflux; brisk carotid pulses without delay and no carotid bruits Chest: clear to auscultation, no signs of consolidation by percussion or palpation, normal fremitus, symmetrical and full respiratory excursions Cardiovascular: normal position and quality of the apical impulse, regular rhythm, normal first and second heart sounds, no murmurs, rubs or gallops Abdomen: no tenderness or distention, no masses by palpation, no abnormal pulsatility or arterial bruits, normal bowel sounds, no hepatosplenomegaly Extremities: no clubbing, cyanosis or edema; 2+ radial, ulnar and brachial pulses bilaterally; 2+ right femoral, posterior tibial and dorsalis pedis pulses; 2+ left femoral, posterior tibial and dorsalis pedis pulses; no subclavian or femoral bruits Neurological: grossly nonfocal Psych: euthymic mood, full affect   EKG:  EKG is ordered today. The ekg ordered today demonstrates NSR, QS in V1-V2 and nonspecific ST changes (old)   Recent Labs: No results found for requested labs within last 365 days.    Lipid Panel No results found for: CHOL, TRIG, HDL, CHOLHDL, VLDL, LDLCALC, LDLDIRECT    Wt Readings from Last 3 Encounters:  03/09/15 188 lb (85.276 kg)  02/08/14 179 lb 6.4 oz (81.375 kg)  11/28/11 175 lb (79.379 kg)      ASSESSMENT AND PLAN:   no symptoms of coronary insufficiency or coronary vasospasm. Rare palpitations are likely secondary to previously documented PVCs. Tolerating statin and now has normal transaminases. Reportedly her labs are in the desirable range (will obtain a copy ). No need for change in her current medical regimen. Noticed the fact that she has gained about 10 pounds in the last year and recommended that she try to avoid  additional weight gain.    Current medicines are reviewed at length with the patient today.  The patient does not have concerns regarding medicines.  The following changes have been made:  no change  Labs/ tests ordered today include:   Orders Placed This Encounter  Procedures  . EKG 12-Lead    Patient Instructions  Dr. Sallyanne Kuster recommends that you schedule a follow-up appointment in: ONE YEAR        SignedSanda Klein, MD  03/10/2015 6:10 PM    Sanda Klein, MD, [facility] [phone] office [phone] pager

## 2015-03-10 ENCOUNTER — Encounter: Payer: Self-pay | Admitting: Cardiovascular Disease

## 2015-03-22 DIAGNOSIS — M0609 Rheumatoid arthritis without rheumatoid factor, multiple sites: Secondary | ICD-10-CM | POA: Diagnosis not present

## 2015-04-17 DIAGNOSIS — H2513 Age-related nuclear cataract, bilateral: Secondary | ICD-10-CM | POA: Diagnosis not present

## 2015-04-17 DIAGNOSIS — M0570 Rheumatoid arthritis with rheumatoid factor of unspecified site without organ or systems involvement: Secondary | ICD-10-CM | POA: Diagnosis not present

## 2015-04-17 DIAGNOSIS — H524 Presbyopia: Secondary | ICD-10-CM | POA: Diagnosis not present

## 2015-04-17 DIAGNOSIS — H04123 Dry eye syndrome of bilateral lacrimal glands: Secondary | ICD-10-CM | POA: Diagnosis not present

## 2015-04-17 DIAGNOSIS — M3500 Sicca syndrome, unspecified: Secondary | ICD-10-CM | POA: Diagnosis not present

## 2015-05-17 DIAGNOSIS — Z79899 Other long term (current) drug therapy: Secondary | ICD-10-CM | POA: Diagnosis not present

## 2015-05-17 DIAGNOSIS — M0609 Rheumatoid arthritis without rheumatoid factor, multiple sites: Secondary | ICD-10-CM | POA: Diagnosis not present

## 2015-05-24 DIAGNOSIS — M3500 Sicca syndrome, unspecified: Secondary | ICD-10-CM | POA: Diagnosis not present

## 2015-05-24 DIAGNOSIS — M0609 Rheumatoid arthritis without rheumatoid factor, multiple sites: Secondary | ICD-10-CM | POA: Diagnosis not present

## 2015-05-24 DIAGNOSIS — M797 Fibromyalgia: Secondary | ICD-10-CM | POA: Diagnosis not present

## 2015-05-24 DIAGNOSIS — Z79899 Other long term (current) drug therapy: Secondary | ICD-10-CM | POA: Diagnosis not present

## 2015-05-24 DIAGNOSIS — M2550 Pain in unspecified joint: Secondary | ICD-10-CM | POA: Diagnosis not present

## 2015-05-30 DIAGNOSIS — H2513 Age-related nuclear cataract, bilateral: Secondary | ICD-10-CM | POA: Diagnosis not present

## 2015-05-30 DIAGNOSIS — M0570 Rheumatoid arthritis with rheumatoid factor of unspecified site without organ or systems involvement: Secondary | ICD-10-CM | POA: Diagnosis not present

## 2015-05-30 DIAGNOSIS — H04123 Dry eye syndrome of bilateral lacrimal glands: Secondary | ICD-10-CM | POA: Diagnosis not present

## 2015-05-30 DIAGNOSIS — M3500 Sicca syndrome, unspecified: Secondary | ICD-10-CM | POA: Diagnosis not present

## 2015-06-05 DIAGNOSIS — M5136 Other intervertebral disc degeneration, lumbar region: Secondary | ICD-10-CM | POA: Diagnosis not present

## 2015-06-05 DIAGNOSIS — M9903 Segmental and somatic dysfunction of lumbar region: Secondary | ICD-10-CM | POA: Diagnosis not present

## 2015-06-05 DIAGNOSIS — M62830 Muscle spasm of back: Secondary | ICD-10-CM | POA: Diagnosis not present

## 2015-06-05 DIAGNOSIS — M545 Low back pain: Secondary | ICD-10-CM | POA: Diagnosis not present

## 2015-06-19 DIAGNOSIS — M5136 Other intervertebral disc degeneration, lumbar region: Secondary | ICD-10-CM | POA: Diagnosis not present

## 2015-06-19 DIAGNOSIS — M545 Low back pain: Secondary | ICD-10-CM | POA: Diagnosis not present

## 2015-06-19 DIAGNOSIS — M9903 Segmental and somatic dysfunction of lumbar region: Secondary | ICD-10-CM | POA: Diagnosis not present

## 2015-06-19 DIAGNOSIS — M62830 Muscle spasm of back: Secondary | ICD-10-CM | POA: Diagnosis not present

## 2015-07-05 DIAGNOSIS — M3500 Sicca syndrome, unspecified: Secondary | ICD-10-CM | POA: Diagnosis not present

## 2015-07-05 DIAGNOSIS — M0689 Other specified rheumatoid arthritis, multiple sites: Secondary | ICD-10-CM | POA: Diagnosis not present

## 2015-07-05 DIAGNOSIS — Z6831 Body mass index (BMI) 31.0-31.9, adult: Secondary | ICD-10-CM | POA: Diagnosis not present

## 2015-07-05 DIAGNOSIS — G4709 Other insomnia: Secondary | ICD-10-CM | POA: Diagnosis not present

## 2015-07-05 DIAGNOSIS — R5383 Other fatigue: Secondary | ICD-10-CM | POA: Diagnosis not present

## 2015-07-12 DIAGNOSIS — Z6831 Body mass index (BMI) 31.0-31.9, adult: Secondary | ICD-10-CM | POA: Diagnosis not present

## 2015-07-12 DIAGNOSIS — L308 Other specified dermatitis: Secondary | ICD-10-CM | POA: Diagnosis not present

## 2015-07-17 DIAGNOSIS — M545 Low back pain: Secondary | ICD-10-CM | POA: Diagnosis not present

## 2015-07-17 DIAGNOSIS — M5136 Other intervertebral disc degeneration, lumbar region: Secondary | ICD-10-CM | POA: Diagnosis not present

## 2015-07-17 DIAGNOSIS — M62830 Muscle spasm of back: Secondary | ICD-10-CM | POA: Diagnosis not present

## 2015-07-17 DIAGNOSIS — M9903 Segmental and somatic dysfunction of lumbar region: Secondary | ICD-10-CM | POA: Diagnosis not present

## 2015-07-23 DIAGNOSIS — M0609 Rheumatoid arthritis without rheumatoid factor, multiple sites: Secondary | ICD-10-CM | POA: Diagnosis not present

## 2015-08-14 DIAGNOSIS — M62830 Muscle spasm of back: Secondary | ICD-10-CM | POA: Diagnosis not present

## 2015-08-14 DIAGNOSIS — M9903 Segmental and somatic dysfunction of lumbar region: Secondary | ICD-10-CM | POA: Diagnosis not present

## 2015-08-14 DIAGNOSIS — M5136 Other intervertebral disc degeneration, lumbar region: Secondary | ICD-10-CM | POA: Diagnosis not present

## 2015-08-14 DIAGNOSIS — M545 Low back pain: Secondary | ICD-10-CM | POA: Diagnosis not present

## 2015-08-22 DIAGNOSIS — Z79899 Other long term (current) drug therapy: Secondary | ICD-10-CM | POA: Diagnosis not present

## 2015-08-22 DIAGNOSIS — M2550 Pain in unspecified joint: Secondary | ICD-10-CM | POA: Diagnosis not present

## 2015-08-22 DIAGNOSIS — M0609 Rheumatoid arthritis without rheumatoid factor, multiple sites: Secondary | ICD-10-CM | POA: Diagnosis not present

## 2015-08-22 DIAGNOSIS — M797 Fibromyalgia: Secondary | ICD-10-CM | POA: Diagnosis not present

## 2015-08-22 DIAGNOSIS — M3500 Sicca syndrome, unspecified: Secondary | ICD-10-CM | POA: Diagnosis not present

## 2015-08-28 DIAGNOSIS — Z124 Encounter for screening for malignant neoplasm of cervix: Secondary | ICD-10-CM | POA: Diagnosis not present

## 2015-08-28 DIAGNOSIS — N3281 Overactive bladder: Secondary | ICD-10-CM | POA: Diagnosis not present

## 2015-08-28 DIAGNOSIS — Z1231 Encounter for screening mammogram for malignant neoplasm of breast: Secondary | ICD-10-CM | POA: Diagnosis not present

## 2015-08-28 DIAGNOSIS — Z6832 Body mass index (BMI) 32.0-32.9, adult: Secondary | ICD-10-CM | POA: Diagnosis not present

## 2015-08-28 DIAGNOSIS — N3945 Continuous leakage: Secondary | ICD-10-CM | POA: Diagnosis not present

## 2015-09-11 DIAGNOSIS — M545 Low back pain: Secondary | ICD-10-CM | POA: Diagnosis not present

## 2015-09-11 DIAGNOSIS — M5136 Other intervertebral disc degeneration, lumbar region: Secondary | ICD-10-CM | POA: Diagnosis not present

## 2015-09-11 DIAGNOSIS — M9903 Segmental and somatic dysfunction of lumbar region: Secondary | ICD-10-CM | POA: Diagnosis not present

## 2015-09-11 DIAGNOSIS — M62830 Muscle spasm of back: Secondary | ICD-10-CM | POA: Diagnosis not present

## 2015-09-17 DIAGNOSIS — M0609 Rheumatoid arthritis without rheumatoid factor, multiple sites: Secondary | ICD-10-CM | POA: Diagnosis not present

## 2015-10-02 DIAGNOSIS — N958 Other specified menopausal and perimenopausal disorders: Secondary | ICD-10-CM | POA: Diagnosis not present

## 2015-10-02 DIAGNOSIS — R140 Abdominal distension (gaseous): Secondary | ICD-10-CM | POA: Diagnosis not present

## 2015-10-23 DIAGNOSIS — M5136 Other intervertebral disc degeneration, lumbar region: Secondary | ICD-10-CM | POA: Diagnosis not present

## 2015-10-23 DIAGNOSIS — M545 Low back pain: Secondary | ICD-10-CM | POA: Diagnosis not present

## 2015-10-23 DIAGNOSIS — M62830 Muscle spasm of back: Secondary | ICD-10-CM | POA: Diagnosis not present

## 2015-10-23 DIAGNOSIS — M9903 Segmental and somatic dysfunction of lumbar region: Secondary | ICD-10-CM | POA: Diagnosis not present

## 2015-11-12 DIAGNOSIS — M0609 Rheumatoid arthritis without rheumatoid factor, multiple sites: Secondary | ICD-10-CM | POA: Diagnosis not present

## 2015-11-16 IMAGING — MG MM DIAGNOSTIC UNILATERAL L
4 series · 4 of 4 positions shown · non-contrast
Comparison: 06/15/2013, 09/17/2011

CLINICAL DATA: Possible mass axillary tail left breast

EXAM:
DIGITAL DIAGNOSTIC  LEFT MAMMOGRAM WITH CAD
ULTRASOUND LEFT BREAST

[L XCCL]
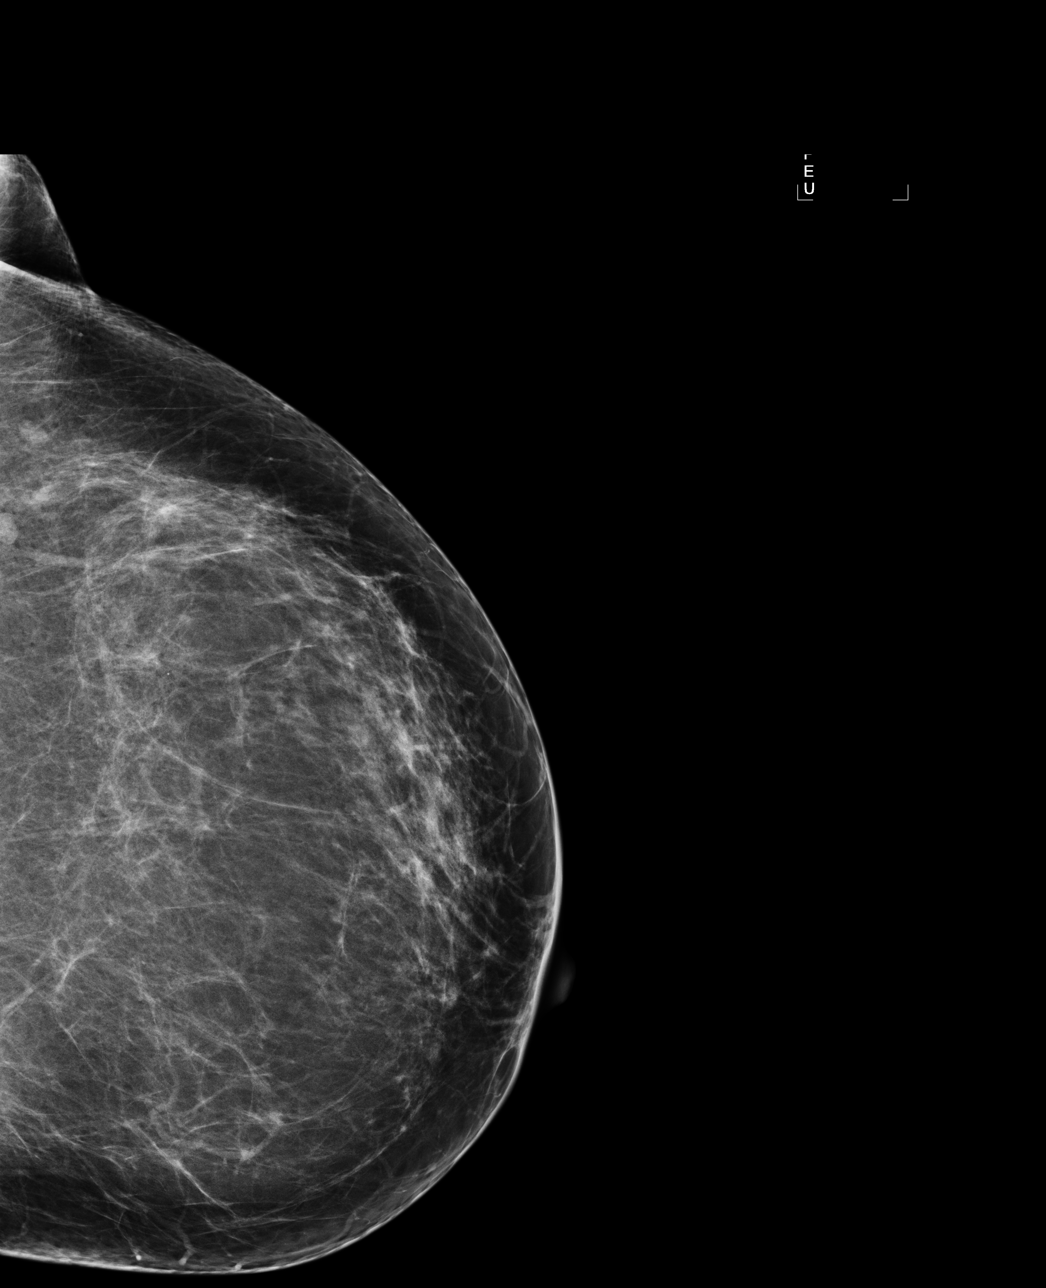

[L MLO (1 of 2)]
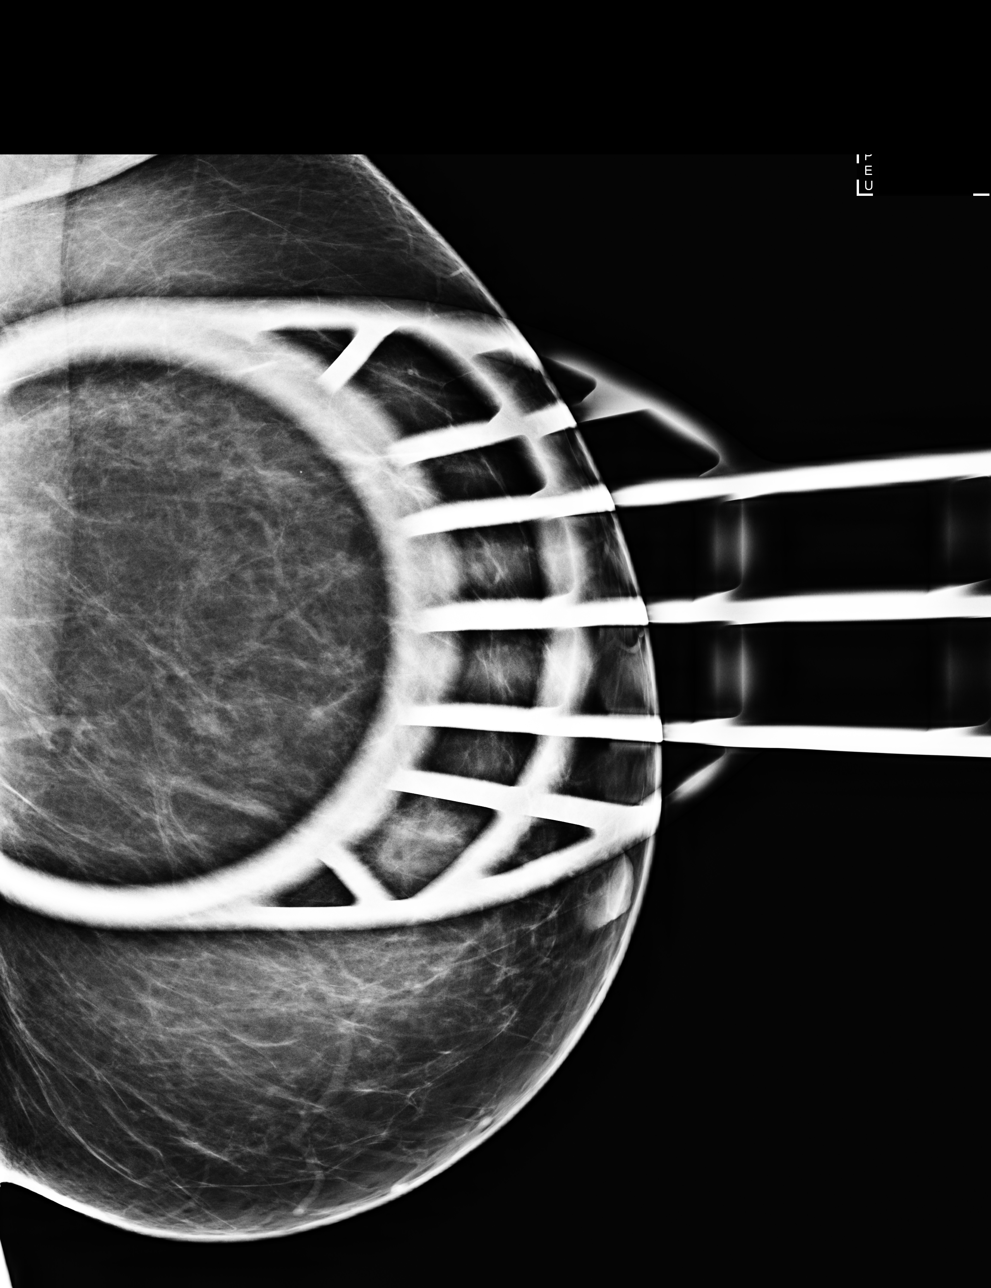

[L ML]
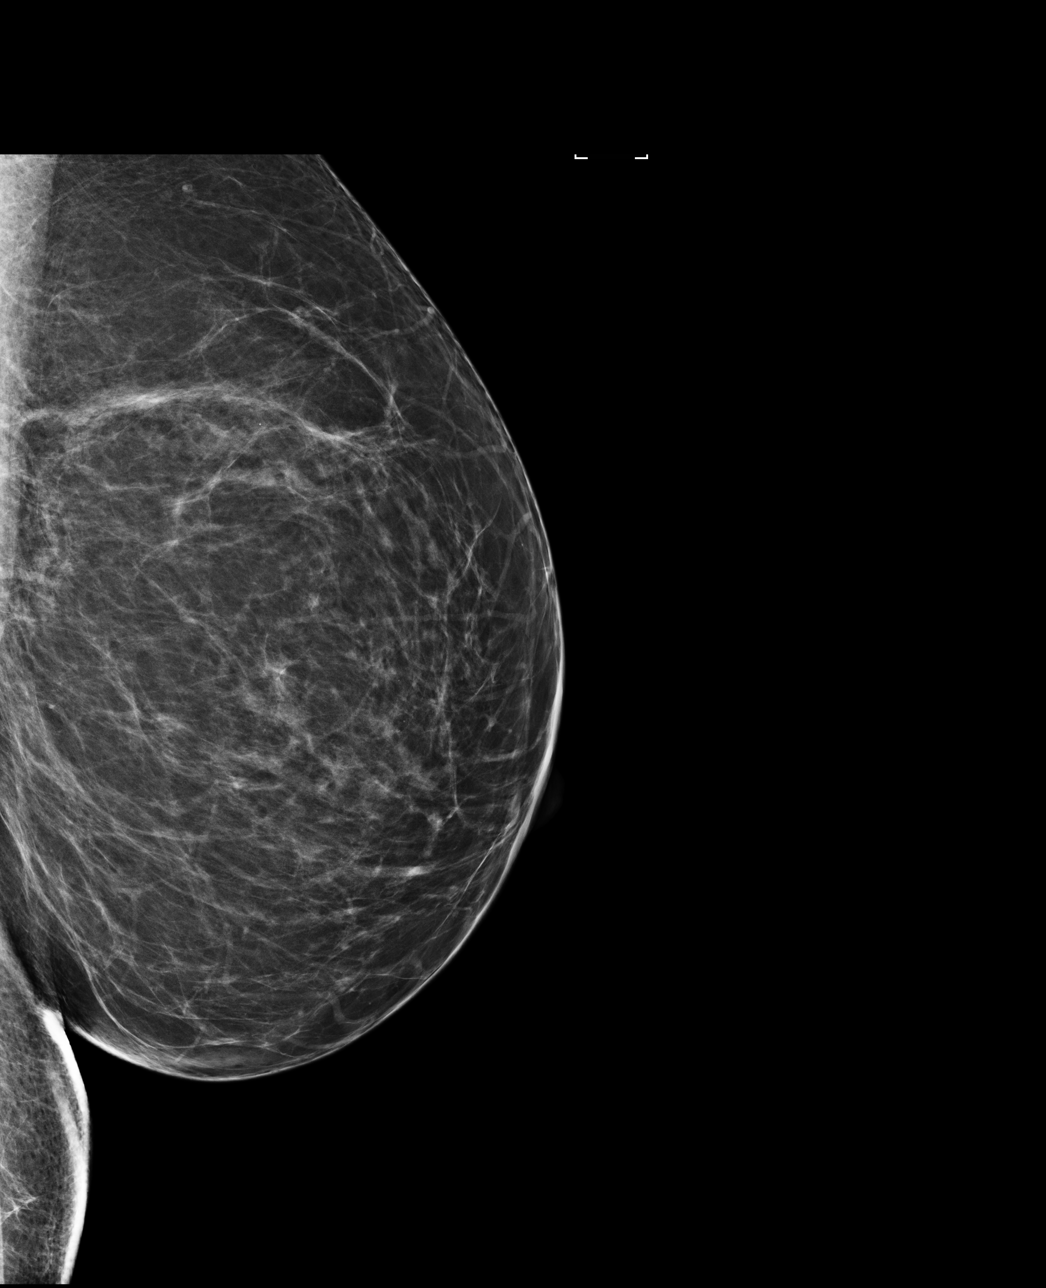

[L MLO (2 of 2)]
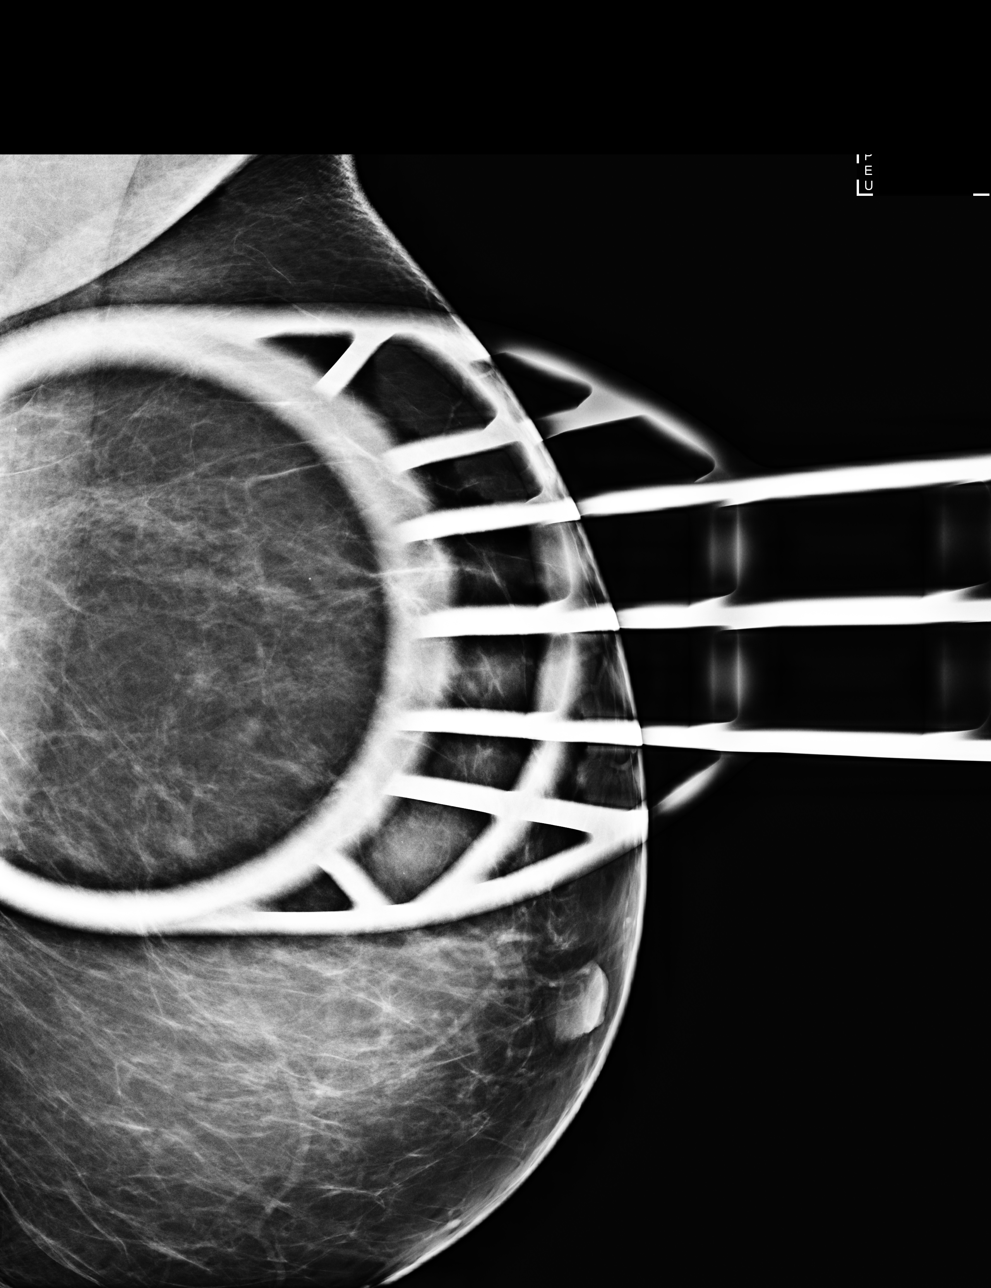

[4 of 4 positions shown; findings below may reference images not displayed]

ACR Breast Density Category b: There are scattered areas of
fibroglandular density.
FINDINGS: There are 2 persistent sub cm oval circumscribed low-attenuation
masses in the lateral axillary tail posteriorly.

Mammographic images were processed with CAD.

On physical exam, there are no palpable abnormalities.

Ultrasound is performed, showing two intramammary lymph nodes in the
far lateral left breast. In the [DATE] position 15 cm from the nipple,
there is a normal intramammary lymph node measuring 5 x 4 x 9 mm. In
the 2 o'clock position 15 cm from the nipple there is another
intramammary lymph node measuring 4 x 3 x 4 mm.
IMPRESSION: Two normal intramammary axillary tail lymph nodes

RECOMMENDATION:
Screening mammogram in 1 year

I have discussed the findings and recommendations with the patient.
Results were also provided in writing at the conclusion of the
visit. If applicable, a reminder letter will be sent to the patient
regarding the next appointment.

BI-RADS CATEGORY  2: Benign Finding(s)

## 2015-11-21 DIAGNOSIS — Z79899 Other long term (current) drug therapy: Secondary | ICD-10-CM | POA: Diagnosis not present

## 2015-11-21 DIAGNOSIS — M797 Fibromyalgia: Secondary | ICD-10-CM | POA: Diagnosis not present

## 2015-11-21 DIAGNOSIS — M0609 Rheumatoid arthritis without rheumatoid factor, multiple sites: Secondary | ICD-10-CM | POA: Diagnosis not present

## 2015-11-21 DIAGNOSIS — M3500 Sicca syndrome, unspecified: Secondary | ICD-10-CM | POA: Diagnosis not present

## 2015-11-21 DIAGNOSIS — M2550 Pain in unspecified joint: Secondary | ICD-10-CM | POA: Diagnosis not present

## 2015-11-22 DIAGNOSIS — M9903 Segmental and somatic dysfunction of lumbar region: Secondary | ICD-10-CM | POA: Diagnosis not present

## 2015-11-22 DIAGNOSIS — M62830 Muscle spasm of back: Secondary | ICD-10-CM | POA: Diagnosis not present

## 2015-11-22 DIAGNOSIS — M545 Low back pain: Secondary | ICD-10-CM | POA: Diagnosis not present

## 2015-11-22 DIAGNOSIS — M5136 Other intervertebral disc degeneration, lumbar region: Secondary | ICD-10-CM | POA: Diagnosis not present

## 2015-12-27 DIAGNOSIS — M545 Low back pain: Secondary | ICD-10-CM | POA: Diagnosis not present

## 2015-12-27 DIAGNOSIS — M9903 Segmental and somatic dysfunction of lumbar region: Secondary | ICD-10-CM | POA: Diagnosis not present

## 2015-12-27 DIAGNOSIS — M5136 Other intervertebral disc degeneration, lumbar region: Secondary | ICD-10-CM | POA: Diagnosis not present

## 2015-12-27 DIAGNOSIS — M62830 Muscle spasm of back: Secondary | ICD-10-CM | POA: Diagnosis not present

## 2016-01-07 DIAGNOSIS — M0609 Rheumatoid arthritis without rheumatoid factor, multiple sites: Secondary | ICD-10-CM | POA: Diagnosis not present

## 2016-01-07 DIAGNOSIS — Z79899 Other long term (current) drug therapy: Secondary | ICD-10-CM | POA: Diagnosis not present

## 2016-01-22 DIAGNOSIS — M5136 Other intervertebral disc degeneration, lumbar region: Secondary | ICD-10-CM | POA: Diagnosis not present

## 2016-01-22 DIAGNOSIS — M62830 Muscle spasm of back: Secondary | ICD-10-CM | POA: Diagnosis not present

## 2016-01-22 DIAGNOSIS — M9903 Segmental and somatic dysfunction of lumbar region: Secondary | ICD-10-CM | POA: Diagnosis not present

## 2016-01-22 DIAGNOSIS — M545 Low back pain: Secondary | ICD-10-CM | POA: Diagnosis not present

## 2016-02-01 DIAGNOSIS — R8299 Other abnormal findings in urine: Secondary | ICD-10-CM | POA: Diagnosis not present

## 2016-02-01 DIAGNOSIS — R358 Other polyuria: Secondary | ICD-10-CM | POA: Diagnosis not present

## 2016-02-01 DIAGNOSIS — I10 Essential (primary) hypertension: Secondary | ICD-10-CM | POA: Diagnosis not present

## 2016-02-08 DIAGNOSIS — I493 Ventricular premature depolarization: Secondary | ICD-10-CM | POA: Diagnosis not present

## 2016-02-08 DIAGNOSIS — M797 Fibromyalgia: Secondary | ICD-10-CM | POA: Diagnosis not present

## 2016-02-08 DIAGNOSIS — M0689 Other specified rheumatoid arthritis, multiple sites: Secondary | ICD-10-CM | POA: Diagnosis not present

## 2016-02-08 DIAGNOSIS — Z6831 Body mass index (BMI) 31.0-31.9, adult: Secondary | ICD-10-CM | POA: Diagnosis not present

## 2016-02-08 DIAGNOSIS — Z23 Encounter for immunization: Secondary | ICD-10-CM | POA: Diagnosis not present

## 2016-02-08 DIAGNOSIS — R5383 Other fatigue: Secondary | ICD-10-CM | POA: Diagnosis not present

## 2016-02-08 DIAGNOSIS — I10 Essential (primary) hypertension: Secondary | ICD-10-CM | POA: Diagnosis not present

## 2016-02-08 DIAGNOSIS — Z0000 Encounter for general adult medical examination without abnormal findings: Secondary | ICD-10-CM | POA: Diagnosis not present

## 2016-02-08 DIAGNOSIS — E789 Disorder of lipoprotein metabolism, unspecified: Secondary | ICD-10-CM | POA: Diagnosis not present

## 2016-02-08 DIAGNOSIS — G4709 Other insomnia: Secondary | ICD-10-CM | POA: Diagnosis not present

## 2016-02-08 DIAGNOSIS — I2510 Atherosclerotic heart disease of native coronary artery without angina pectoris: Secondary | ICD-10-CM | POA: Diagnosis not present

## 2016-02-12 DIAGNOSIS — Z1212 Encounter for screening for malignant neoplasm of rectum: Secondary | ICD-10-CM | POA: Diagnosis not present

## 2016-02-19 DIAGNOSIS — M545 Low back pain: Secondary | ICD-10-CM | POA: Diagnosis not present

## 2016-02-19 DIAGNOSIS — M9903 Segmental and somatic dysfunction of lumbar region: Secondary | ICD-10-CM | POA: Diagnosis not present

## 2016-02-19 DIAGNOSIS — M62830 Muscle spasm of back: Secondary | ICD-10-CM | POA: Diagnosis not present

## 2016-02-19 DIAGNOSIS — M5136 Other intervertebral disc degeneration, lumbar region: Secondary | ICD-10-CM | POA: Diagnosis not present

## 2016-02-21 DIAGNOSIS — M2550 Pain in unspecified joint: Secondary | ICD-10-CM | POA: Diagnosis not present

## 2016-02-21 DIAGNOSIS — M797 Fibromyalgia: Secondary | ICD-10-CM | POA: Diagnosis not present

## 2016-02-21 DIAGNOSIS — M0609 Rheumatoid arthritis without rheumatoid factor, multiple sites: Secondary | ICD-10-CM | POA: Diagnosis not present

## 2016-02-21 DIAGNOSIS — Z79899 Other long term (current) drug therapy: Secondary | ICD-10-CM | POA: Diagnosis not present

## 2016-02-21 DIAGNOSIS — M3500 Sicca syndrome, unspecified: Secondary | ICD-10-CM | POA: Diagnosis not present

## 2016-03-03 DIAGNOSIS — M0609 Rheumatoid arthritis without rheumatoid factor, multiple sites: Secondary | ICD-10-CM | POA: Diagnosis not present

## 2016-03-18 DIAGNOSIS — M62830 Muscle spasm of back: Secondary | ICD-10-CM | POA: Diagnosis not present

## 2016-03-18 DIAGNOSIS — M9903 Segmental and somatic dysfunction of lumbar region: Secondary | ICD-10-CM | POA: Diagnosis not present

## 2016-03-18 DIAGNOSIS — M5136 Other intervertebral disc degeneration, lumbar region: Secondary | ICD-10-CM | POA: Diagnosis not present

## 2016-03-18 DIAGNOSIS — M545 Low back pain: Secondary | ICD-10-CM | POA: Diagnosis not present

## 2016-03-24 DIAGNOSIS — H2513 Age-related nuclear cataract, bilateral: Secondary | ICD-10-CM | POA: Diagnosis not present

## 2016-03-24 DIAGNOSIS — M0570 Rheumatoid arthritis with rheumatoid factor of unspecified site without organ or systems involvement: Secondary | ICD-10-CM | POA: Diagnosis not present

## 2016-03-24 DIAGNOSIS — M3500 Sicca syndrome, unspecified: Secondary | ICD-10-CM | POA: Diagnosis not present

## 2016-03-24 DIAGNOSIS — H10503 Unspecified blepharoconjunctivitis, bilateral: Secondary | ICD-10-CM | POA: Diagnosis not present

## 2016-03-24 DIAGNOSIS — H04123 Dry eye syndrome of bilateral lacrimal glands: Secondary | ICD-10-CM | POA: Diagnosis not present

## 2016-03-31 DIAGNOSIS — H04123 Dry eye syndrome of bilateral lacrimal glands: Secondary | ICD-10-CM | POA: Diagnosis not present

## 2016-03-31 DIAGNOSIS — H10503 Unspecified blepharoconjunctivitis, bilateral: Secondary | ICD-10-CM | POA: Diagnosis not present

## 2016-03-31 DIAGNOSIS — M3500 Sicca syndrome, unspecified: Secondary | ICD-10-CM | POA: Diagnosis not present

## 2016-03-31 DIAGNOSIS — M0570 Rheumatoid arthritis with rheumatoid factor of unspecified site without organ or systems involvement: Secondary | ICD-10-CM | POA: Diagnosis not present

## 2016-03-31 DIAGNOSIS — H2513 Age-related nuclear cataract, bilateral: Secondary | ICD-10-CM | POA: Diagnosis not present

## 2016-04-14 DIAGNOSIS — M3500 Sicca syndrome, unspecified: Secondary | ICD-10-CM | POA: Diagnosis not present

## 2016-04-14 DIAGNOSIS — M0609 Rheumatoid arthritis without rheumatoid factor, multiple sites: Secondary | ICD-10-CM | POA: Diagnosis not present

## 2016-04-14 DIAGNOSIS — Z79899 Other long term (current) drug therapy: Secondary | ICD-10-CM | POA: Diagnosis not present

## 2016-04-14 DIAGNOSIS — E669 Obesity, unspecified: Secondary | ICD-10-CM | POA: Diagnosis not present

## 2016-04-14 DIAGNOSIS — M2550 Pain in unspecified joint: Secondary | ICD-10-CM | POA: Diagnosis not present

## 2016-04-14 DIAGNOSIS — M797 Fibromyalgia: Secondary | ICD-10-CM | POA: Diagnosis not present

## 2016-04-14 DIAGNOSIS — Z6832 Body mass index (BMI) 32.0-32.9, adult: Secondary | ICD-10-CM | POA: Diagnosis not present

## 2016-04-15 DIAGNOSIS — M9901 Segmental and somatic dysfunction of cervical region: Secondary | ICD-10-CM | POA: Diagnosis not present

## 2016-04-15 DIAGNOSIS — M5030 Other cervical disc degeneration, unspecified cervical region: Secondary | ICD-10-CM | POA: Diagnosis not present

## 2016-04-15 DIAGNOSIS — M542 Cervicalgia: Secondary | ICD-10-CM | POA: Diagnosis not present

## 2016-04-15 DIAGNOSIS — M5413 Radiculopathy, cervicothoracic region: Secondary | ICD-10-CM | POA: Diagnosis not present

## 2016-04-17 DIAGNOSIS — M3500 Sicca syndrome, unspecified: Secondary | ICD-10-CM | POA: Diagnosis not present

## 2016-04-17 DIAGNOSIS — Z79899 Other long term (current) drug therapy: Secondary | ICD-10-CM | POA: Diagnosis not present

## 2016-04-17 DIAGNOSIS — H52223 Regular astigmatism, bilateral: Secondary | ICD-10-CM | POA: Diagnosis not present

## 2016-04-17 DIAGNOSIS — H2513 Age-related nuclear cataract, bilateral: Secondary | ICD-10-CM | POA: Diagnosis not present

## 2016-04-17 DIAGNOSIS — H524 Presbyopia: Secondary | ICD-10-CM | POA: Diagnosis not present

## 2016-04-17 DIAGNOSIS — H04123 Dry eye syndrome of bilateral lacrimal glands: Secondary | ICD-10-CM | POA: Diagnosis not present

## 2016-04-28 DIAGNOSIS — M0609 Rheumatoid arthritis without rheumatoid factor, multiple sites: Secondary | ICD-10-CM | POA: Diagnosis not present

## 2016-05-15 DIAGNOSIS — M9901 Segmental and somatic dysfunction of cervical region: Secondary | ICD-10-CM | POA: Diagnosis not present

## 2016-05-15 DIAGNOSIS — M542 Cervicalgia: Secondary | ICD-10-CM | POA: Diagnosis not present

## 2016-05-15 DIAGNOSIS — M5413 Radiculopathy, cervicothoracic region: Secondary | ICD-10-CM | POA: Diagnosis not present

## 2016-05-15 DIAGNOSIS — M5030 Other cervical disc degeneration, unspecified cervical region: Secondary | ICD-10-CM | POA: Diagnosis not present

## 2016-06-16 IMAGING — US US ABDOMEN LIMITED
1 series · 14 of 25 positions shown · non-contrast
Comparison: None.

CLINICAL DATA: Elevated liver enzymes

EXAM:
US ABDOMEN LIMITED - RIGHT UPPER QUADRANT

[Series 1: us abdomen limited · 0.33mm/px · 14 of 35 slices shown]
[im 1/35]
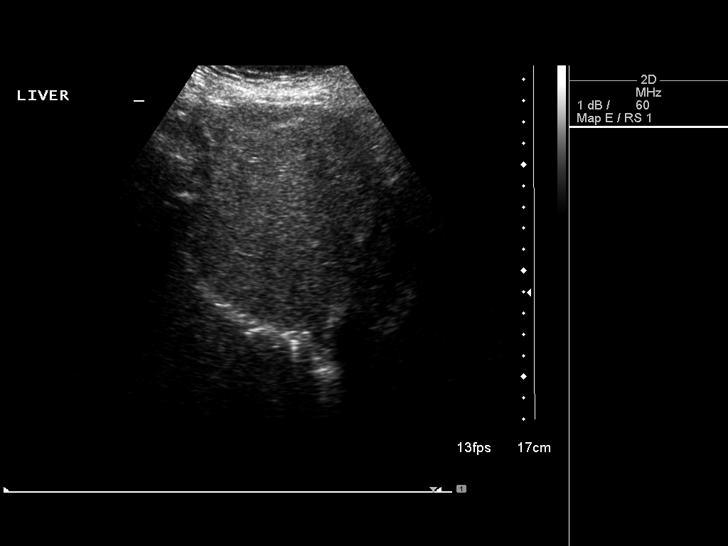
[im 3/35]
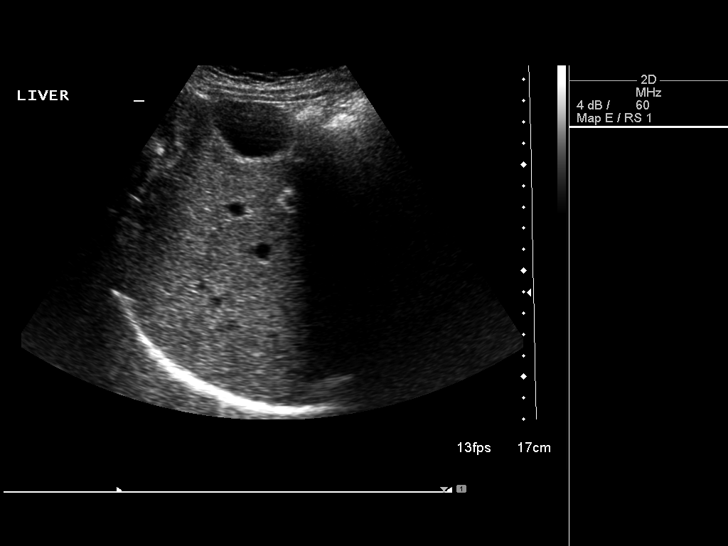
[im 6/35]
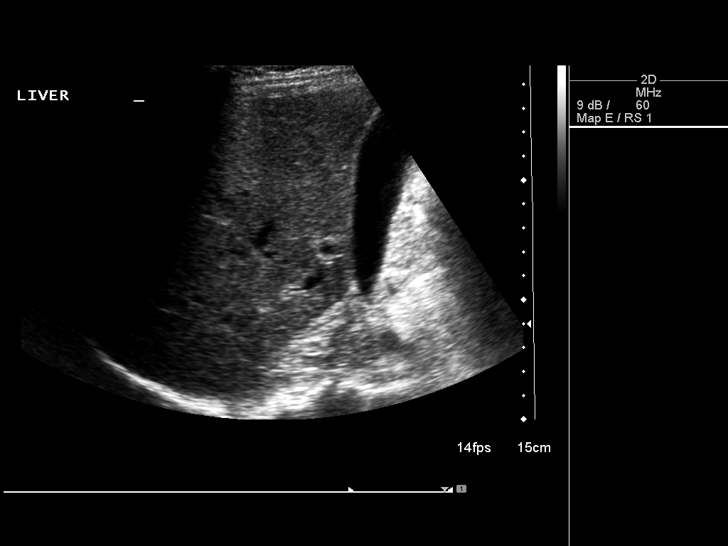
[im 9/35]
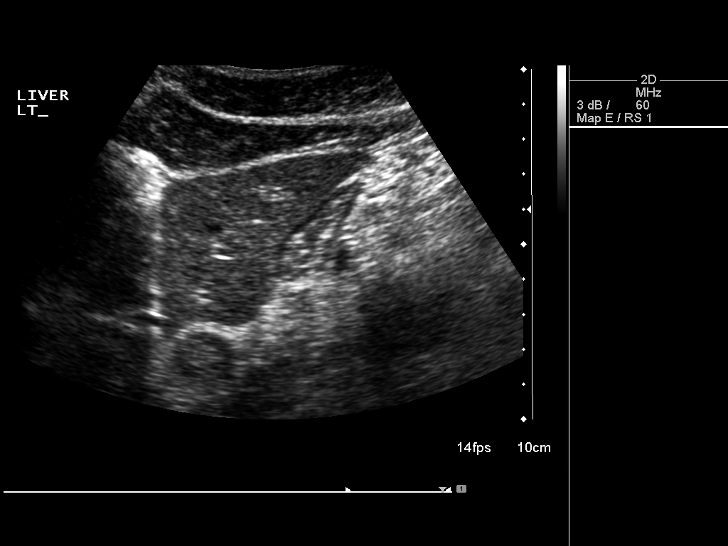
[im 12/35]
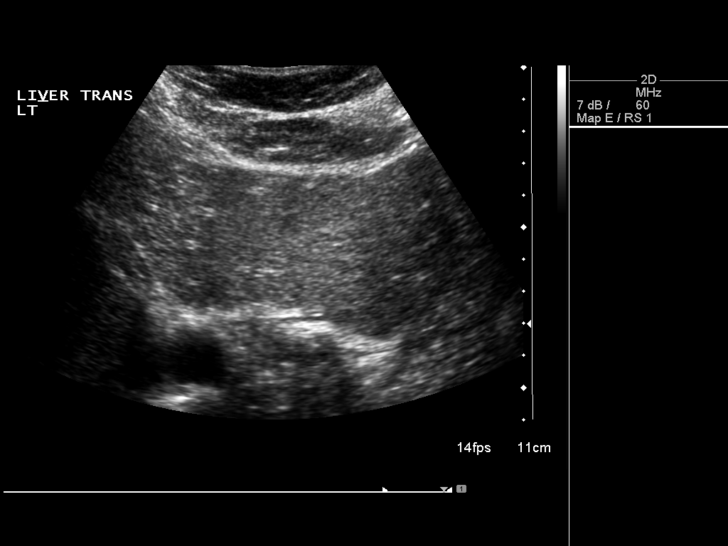
[im 13/35]
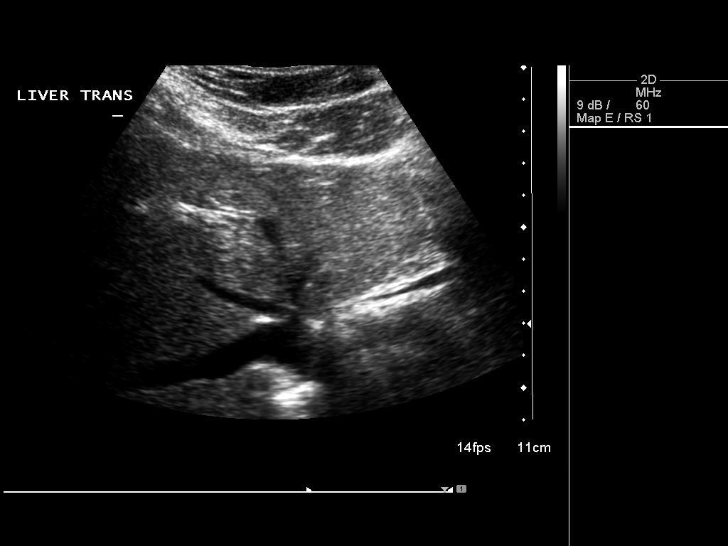
[im 16/35]
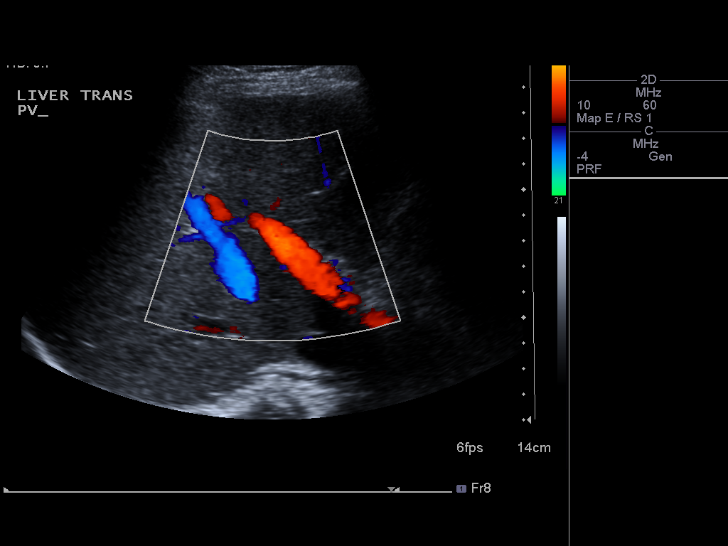
[im 19/35]
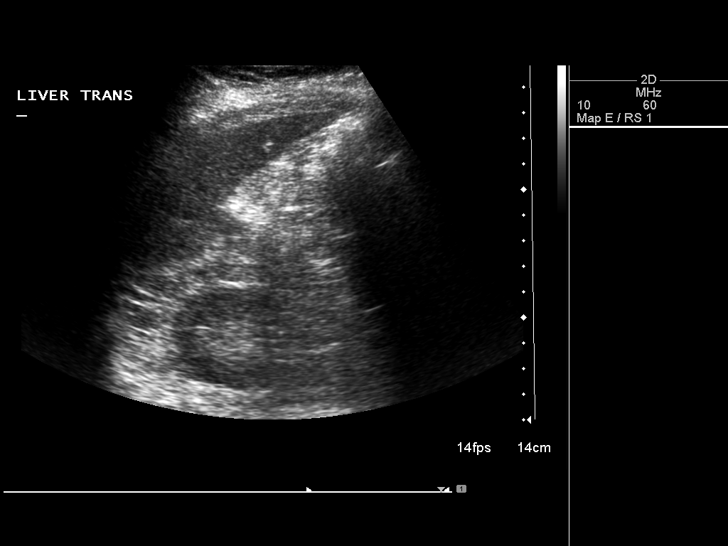
[im 22/35]
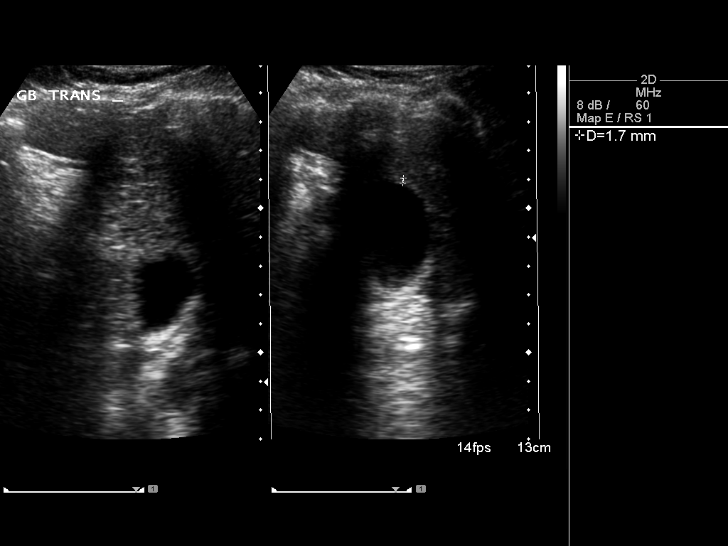
[im 23/35]
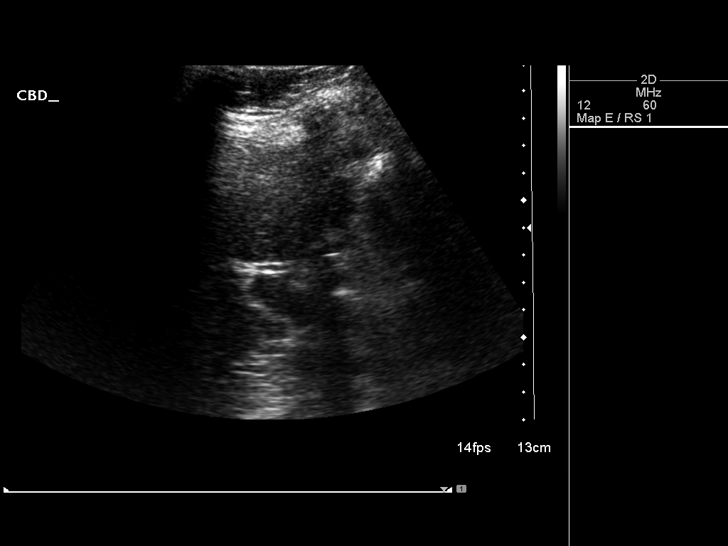
[im 26/35]
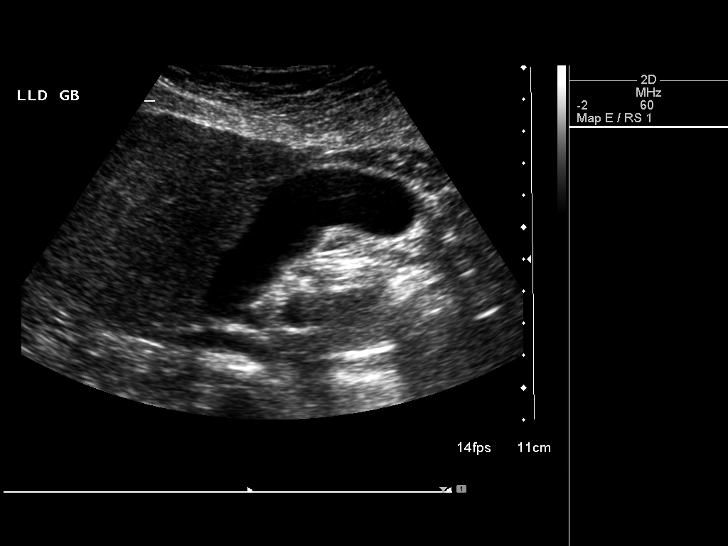
[im 29/35]
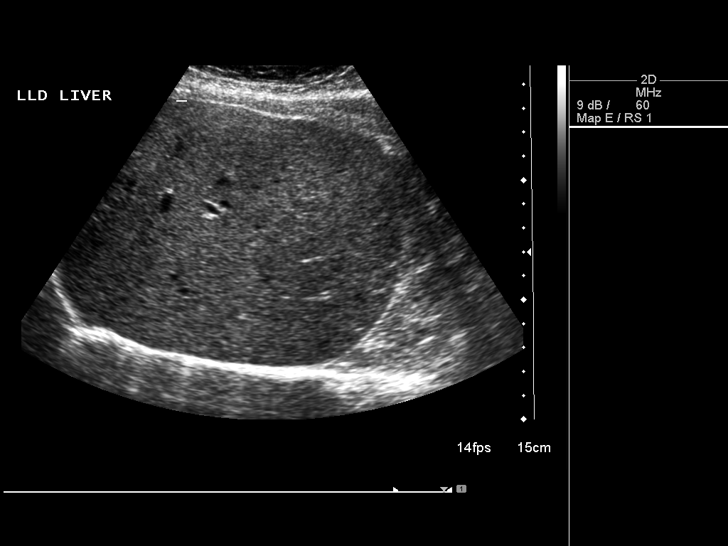
[im 32/35]
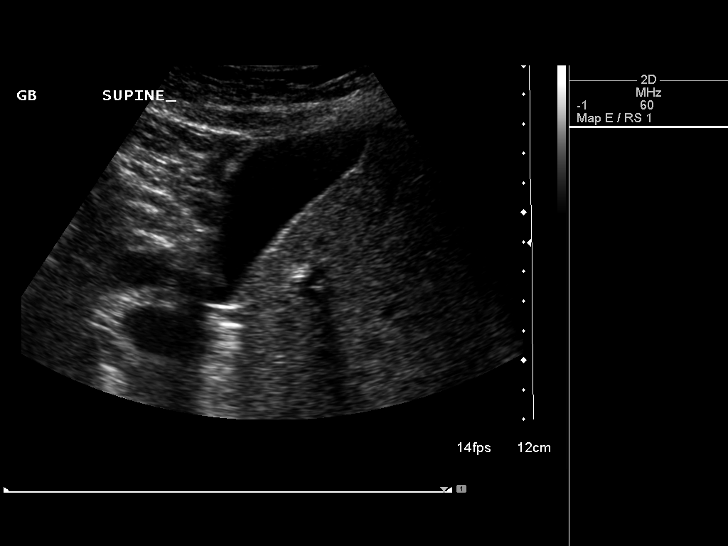
[im 35/35]
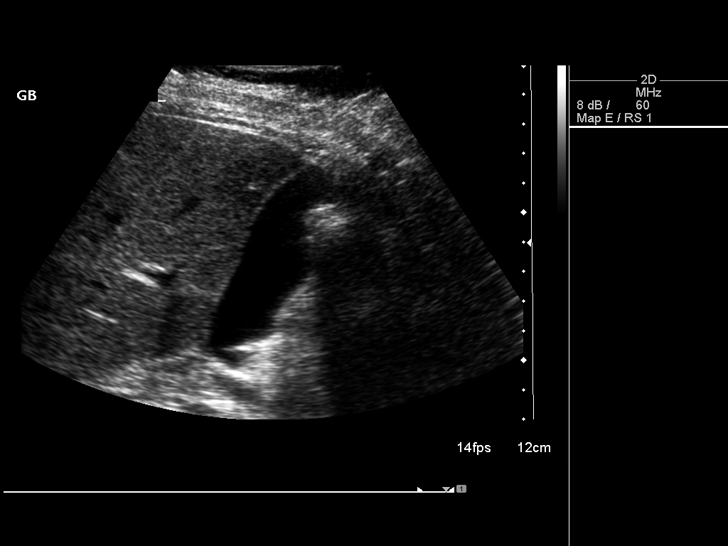

[14 of 25 positions shown; findings below may reference images not displayed]

FINDINGS: Gallbladder:

No gallstones or wall thickening visualized. There is no
pericholecystic fluid. No sonographic Murphy sign noted.

Common bile duct:

Diameter: 2 mm. There is no intrahepatic or extrahepatic biliary
duct dilatation.

Liver:

No focal lesion identified. Within normal limits in parenchymal
echogenicity.
IMPRESSION: Study within normal limits.

## 2016-06-23 DIAGNOSIS — M0609 Rheumatoid arthritis without rheumatoid factor, multiple sites: Secondary | ICD-10-CM | POA: Diagnosis not present

## 2016-06-26 DIAGNOSIS — M9901 Segmental and somatic dysfunction of cervical region: Secondary | ICD-10-CM | POA: Diagnosis not present

## 2016-06-26 DIAGNOSIS — M5413 Radiculopathy, cervicothoracic region: Secondary | ICD-10-CM | POA: Diagnosis not present

## 2016-06-26 DIAGNOSIS — M5030 Other cervical disc degeneration, unspecified cervical region: Secondary | ICD-10-CM | POA: Diagnosis not present

## 2016-06-26 DIAGNOSIS — M542 Cervicalgia: Secondary | ICD-10-CM | POA: Diagnosis not present

## 2016-07-15 DIAGNOSIS — Z79899 Other long term (current) drug therapy: Secondary | ICD-10-CM | POA: Diagnosis not present

## 2016-07-15 DIAGNOSIS — E669 Obesity, unspecified: Secondary | ICD-10-CM | POA: Diagnosis not present

## 2016-07-15 DIAGNOSIS — M3500 Sicca syndrome, unspecified: Secondary | ICD-10-CM | POA: Diagnosis not present

## 2016-07-15 DIAGNOSIS — Z6832 Body mass index (BMI) 32.0-32.9, adult: Secondary | ICD-10-CM | POA: Diagnosis not present

## 2016-07-15 DIAGNOSIS — M2550 Pain in unspecified joint: Secondary | ICD-10-CM | POA: Diagnosis not present

## 2016-07-15 DIAGNOSIS — M797 Fibromyalgia: Secondary | ICD-10-CM | POA: Diagnosis not present

## 2016-07-15 DIAGNOSIS — M0609 Rheumatoid arthritis without rheumatoid factor, multiple sites: Secondary | ICD-10-CM | POA: Diagnosis not present

## 2016-07-17 DIAGNOSIS — M542 Cervicalgia: Secondary | ICD-10-CM | POA: Diagnosis not present

## 2016-07-17 DIAGNOSIS — M9901 Segmental and somatic dysfunction of cervical region: Secondary | ICD-10-CM | POA: Diagnosis not present

## 2016-07-17 DIAGNOSIS — M5413 Radiculopathy, cervicothoracic region: Secondary | ICD-10-CM | POA: Diagnosis not present

## 2016-07-17 DIAGNOSIS — M5030 Other cervical disc degeneration, unspecified cervical region: Secondary | ICD-10-CM | POA: Diagnosis not present

## 2016-08-14 DIAGNOSIS — M5413 Radiculopathy, cervicothoracic region: Secondary | ICD-10-CM | POA: Diagnosis not present

## 2016-08-14 DIAGNOSIS — M5030 Other cervical disc degeneration, unspecified cervical region: Secondary | ICD-10-CM | POA: Diagnosis not present

## 2016-08-14 DIAGNOSIS — M9901 Segmental and somatic dysfunction of cervical region: Secondary | ICD-10-CM | POA: Diagnosis not present

## 2016-08-14 DIAGNOSIS — M542 Cervicalgia: Secondary | ICD-10-CM | POA: Diagnosis not present

## 2016-08-18 DIAGNOSIS — M0609 Rheumatoid arthritis without rheumatoid factor, multiple sites: Secondary | ICD-10-CM | POA: Diagnosis not present

## 2016-09-11 DIAGNOSIS — M5413 Radiculopathy, cervicothoracic region: Secondary | ICD-10-CM | POA: Diagnosis not present

## 2016-09-11 DIAGNOSIS — M542 Cervicalgia: Secondary | ICD-10-CM | POA: Diagnosis not present

## 2016-09-11 DIAGNOSIS — M9901 Segmental and somatic dysfunction of cervical region: Secondary | ICD-10-CM | POA: Diagnosis not present

## 2016-09-11 DIAGNOSIS — M5030 Other cervical disc degeneration, unspecified cervical region: Secondary | ICD-10-CM | POA: Diagnosis not present

## 2016-09-16 DIAGNOSIS — J343 Hypertrophy of nasal turbinates: Secondary | ICD-10-CM | POA: Diagnosis not present

## 2016-10-13 DIAGNOSIS — M0609 Rheumatoid arthritis without rheumatoid factor, multiple sites: Secondary | ICD-10-CM | POA: Diagnosis not present

## 2016-10-14 DIAGNOSIS — Z1231 Encounter for screening mammogram for malignant neoplasm of breast: Secondary | ICD-10-CM | POA: Diagnosis not present

## 2016-10-14 DIAGNOSIS — Z6831 Body mass index (BMI) 31.0-31.9, adult: Secondary | ICD-10-CM | POA: Diagnosis not present

## 2016-10-14 DIAGNOSIS — Z01419 Encounter for gynecological examination (general) (routine) without abnormal findings: Secondary | ICD-10-CM | POA: Diagnosis not present

## 2016-10-16 DIAGNOSIS — M2550 Pain in unspecified joint: Secondary | ICD-10-CM | POA: Diagnosis not present

## 2016-10-16 DIAGNOSIS — Z6831 Body mass index (BMI) 31.0-31.9, adult: Secondary | ICD-10-CM | POA: Diagnosis not present

## 2016-10-16 DIAGNOSIS — E669 Obesity, unspecified: Secondary | ICD-10-CM | POA: Diagnosis not present

## 2016-10-16 DIAGNOSIS — M797 Fibromyalgia: Secondary | ICD-10-CM | POA: Diagnosis not present

## 2016-10-16 DIAGNOSIS — Z79899 Other long term (current) drug therapy: Secondary | ICD-10-CM | POA: Diagnosis not present

## 2016-10-16 DIAGNOSIS — M0609 Rheumatoid arthritis without rheumatoid factor, multiple sites: Secondary | ICD-10-CM | POA: Diagnosis not present

## 2016-10-16 DIAGNOSIS — M3500 Sicca syndrome, unspecified: Secondary | ICD-10-CM | POA: Diagnosis not present

## 2016-10-23 DIAGNOSIS — M9901 Segmental and somatic dysfunction of cervical region: Secondary | ICD-10-CM | POA: Diagnosis not present

## 2016-10-23 DIAGNOSIS — M542 Cervicalgia: Secondary | ICD-10-CM | POA: Diagnosis not present

## 2016-10-23 DIAGNOSIS — M5413 Radiculopathy, cervicothoracic region: Secondary | ICD-10-CM | POA: Diagnosis not present

## 2016-10-23 DIAGNOSIS — M5030 Other cervical disc degeneration, unspecified cervical region: Secondary | ICD-10-CM | POA: Diagnosis not present

## 2016-11-27 DIAGNOSIS — M5413 Radiculopathy, cervicothoracic region: Secondary | ICD-10-CM | POA: Diagnosis not present

## 2016-11-27 DIAGNOSIS — M5030 Other cervical disc degeneration, unspecified cervical region: Secondary | ICD-10-CM | POA: Diagnosis not present

## 2016-11-27 DIAGNOSIS — M542 Cervicalgia: Secondary | ICD-10-CM | POA: Diagnosis not present

## 2016-11-27 DIAGNOSIS — M9901 Segmental and somatic dysfunction of cervical region: Secondary | ICD-10-CM | POA: Diagnosis not present

## 2016-12-08 DIAGNOSIS — Z79899 Other long term (current) drug therapy: Secondary | ICD-10-CM | POA: Diagnosis not present

## 2016-12-08 DIAGNOSIS — M0609 Rheumatoid arthritis without rheumatoid factor, multiple sites: Secondary | ICD-10-CM | POA: Diagnosis not present

## 2016-12-25 DIAGNOSIS — M5413 Radiculopathy, cervicothoracic region: Secondary | ICD-10-CM | POA: Diagnosis not present

## 2016-12-25 DIAGNOSIS — M5030 Other cervical disc degeneration, unspecified cervical region: Secondary | ICD-10-CM | POA: Diagnosis not present

## 2016-12-25 DIAGNOSIS — M9901 Segmental and somatic dysfunction of cervical region: Secondary | ICD-10-CM | POA: Diagnosis not present

## 2016-12-25 DIAGNOSIS — M542 Cervicalgia: Secondary | ICD-10-CM | POA: Diagnosis not present

## 2017-02-02 DIAGNOSIS — M0609 Rheumatoid arthritis without rheumatoid factor, multiple sites: Secondary | ICD-10-CM | POA: Diagnosis not present

## 2017-02-03 DIAGNOSIS — R8299 Other abnormal findings in urine: Secondary | ICD-10-CM | POA: Diagnosis not present

## 2017-02-03 DIAGNOSIS — I10 Essential (primary) hypertension: Secondary | ICD-10-CM | POA: Diagnosis not present

## 2017-02-09 DIAGNOSIS — I493 Ventricular premature depolarization: Secondary | ICD-10-CM | POA: Diagnosis not present

## 2017-02-09 DIAGNOSIS — M069 Rheumatoid arthritis, unspecified: Secondary | ICD-10-CM | POA: Diagnosis not present

## 2017-02-09 DIAGNOSIS — I119 Hypertensive heart disease without heart failure: Secondary | ICD-10-CM | POA: Diagnosis not present

## 2017-02-09 DIAGNOSIS — E789 Disorder of lipoprotein metabolism, unspecified: Secondary | ICD-10-CM | POA: Diagnosis not present

## 2017-02-09 DIAGNOSIS — Z6830 Body mass index (BMI) 30.0-30.9, adult: Secondary | ICD-10-CM | POA: Diagnosis not present

## 2017-02-09 DIAGNOSIS — D8989 Other specified disorders involving the immune mechanism, not elsewhere classified: Secondary | ICD-10-CM | POA: Diagnosis not present

## 2017-02-09 DIAGNOSIS — Z23 Encounter for immunization: Secondary | ICD-10-CM | POA: Diagnosis not present

## 2017-02-09 DIAGNOSIS — M797 Fibromyalgia: Secondary | ICD-10-CM | POA: Diagnosis not present

## 2017-02-09 DIAGNOSIS — Z1389 Encounter for screening for other disorder: Secondary | ICD-10-CM | POA: Diagnosis not present

## 2017-02-09 DIAGNOSIS — M3500 Sicca syndrome, unspecified: Secondary | ICD-10-CM | POA: Diagnosis not present

## 2017-02-09 DIAGNOSIS — I2510 Atherosclerotic heart disease of native coronary artery without angina pectoris: Secondary | ICD-10-CM | POA: Diagnosis not present

## 2017-02-09 DIAGNOSIS — Z0000 Encounter for general adult medical examination without abnormal findings: Secondary | ICD-10-CM | POA: Diagnosis not present

## 2017-02-11 DIAGNOSIS — Z1212 Encounter for screening for malignant neoplasm of rectum: Secondary | ICD-10-CM | POA: Diagnosis not present

## 2017-03-10 DIAGNOSIS — Z23 Encounter for immunization: Secondary | ICD-10-CM | POA: Diagnosis not present

## 2017-03-30 DIAGNOSIS — Z79899 Other long term (current) drug therapy: Secondary | ICD-10-CM | POA: Diagnosis not present

## 2017-03-30 DIAGNOSIS — M0609 Rheumatoid arthritis without rheumatoid factor, multiple sites: Secondary | ICD-10-CM | POA: Diagnosis not present

## 2017-04-16 DIAGNOSIS — M0609 Rheumatoid arthritis without rheumatoid factor, multiple sites: Secondary | ICD-10-CM | POA: Diagnosis not present

## 2017-04-16 DIAGNOSIS — M2550 Pain in unspecified joint: Secondary | ICD-10-CM | POA: Diagnosis not present

## 2017-04-16 DIAGNOSIS — M797 Fibromyalgia: Secondary | ICD-10-CM | POA: Diagnosis not present

## 2017-04-16 DIAGNOSIS — M3500 Sicca syndrome, unspecified: Secondary | ICD-10-CM | POA: Diagnosis not present

## 2017-04-16 DIAGNOSIS — Z79899 Other long term (current) drug therapy: Secondary | ICD-10-CM | POA: Diagnosis not present

## 2017-04-16 DIAGNOSIS — G4700 Insomnia, unspecified: Secondary | ICD-10-CM | POA: Diagnosis not present

## 2017-04-16 DIAGNOSIS — Z6830 Body mass index (BMI) 30.0-30.9, adult: Secondary | ICD-10-CM | POA: Diagnosis not present

## 2017-04-16 DIAGNOSIS — E669 Obesity, unspecified: Secondary | ICD-10-CM | POA: Diagnosis not present

## 2017-04-24 ENCOUNTER — Ambulatory Visit (INDEPENDENT_AMBULATORY_CARE_PROVIDER_SITE_OTHER): Payer: Medicare Other | Admitting: Cardiovascular Disease

## 2017-04-24 ENCOUNTER — Encounter: Payer: Self-pay | Admitting: Cardiovascular Disease

## 2017-04-24 VITALS — BP 134/86 | HR 73 | Ht 64.25 in | Wt 181.0 lb

## 2017-04-24 DIAGNOSIS — E7800 Pure hypercholesterolemia, unspecified: Secondary | ICD-10-CM | POA: Diagnosis not present

## 2017-04-24 DIAGNOSIS — I201 Angina pectoris with documented spasm: Secondary | ICD-10-CM

## 2017-04-24 DIAGNOSIS — M0600 Rheumatoid arthritis without rheumatoid factor, unspecified site: Secondary | ICD-10-CM | POA: Diagnosis not present

## 2017-04-24 DIAGNOSIS — I10 Essential (primary) hypertension: Secondary | ICD-10-CM

## 2017-04-24 NOTE — Progress Notes (Signed)
Cardiology Office Note:    Date:  04/24/2017   ID:  Ashley Hoover, DOB 10-Oct-1950, MRN [id number]  PCP:  Shon Baton, MD  Cardiologist:  Sanda Klein, MD    Referring MD: Shon Baton, MD   Chief complaint: Follow-up HTN, hyperlipidemia and family history of coronary disease  History of Present Illness:    Ashley Hoover is a 66 y.o. female with a hx of HTN and hyperlipidemia returning for follow-up.  She has not had any cardiac problems since her last appointment.  She is tolerating her statin and has an excellent lipid profile.  She continues to have a variety of musculoskeletal complaints, but these have dramatically improved after she started treatment with a biological for rheumatoid arthritis and Sjogren's syndrome.  It seems that her fibromyalgia was actually a true rheumatoid arthritis type syndrome.  She does not have rheumatoid factor.  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  Her blood pressure is well controlled.  2008 she had a cardiac catheterization that showed very mild coronary atherosclerosis but also showed vasospasm in the proximal right coronary artery.  She has not had problems with chest pain in several years.  In the past she had frequent PVCs which are asymptomatic.   Past Medical History:  Diagnosis Date  . Anemia    hx of   . Anginal pain ([place])    xh of 4 years ago   . Anxiety   . Arthritis   . Depression   . Dysrhythmia   . Fibromyalgia   . Headache(784.0)    occasional   . Hypertension   . Peripheral vascular disease [facility])     Past Surgical History:  Procedure Laterality  Date  . APPENDECTOMY     age 75  . CARPAL TUNNEL RELEASE     right   . JOINT REPLACEMENT     bilateral knee replacements   . TOTAL HIP ARTHROPLASTY  11/28/2011   Procedure: TOTAL HIP ARTHROPLASTY;  Surgeon: Gearlean Alf, MD;  Location: [facility];  Service: Orthopedics;  Laterality: Right;  . VEIN LIGATION AND STRIPPING     age 54     Current Medications: No outpatient medications have been marked as taking for the 04/24/17 encounter (Appointment) with Sanda Klein, MD.     Allergies:   Methotrexate derivatives; Penicillins; and Restasis [cyclosporine]   Social History   Socioeconomic History  . Marital status: Married    Spouse name: Not on file  . Number of children: Not on file  . Years of education: Not on file  . Highest education level: Not on file  Social Needs  . Financial resource strain: Not on file  . Food insecurity - worry: Not on file  . Food insecurity - inability: Not on file  . Transportation needs - medical: Not on file  . Transportation needs - non-medical: Not on file  Occupational History  . Not on file  Tobacco Use  . Smoking status: Never Smoker  . Smokeless tobacco: Never Used  Substance and Sexual Activity  . Alcohol use: Yes    Alcohol/week: 0.6 oz    Types: 1 Glasses of wine per week  . Drug use: No  .  Sexual activity: Not on file  Other Topics Concern  . Not on file  Social History Narrative  . Not on file     Family History: The patient's family history is not on file. ROS:   Please see the history of present illness.     All other systems reviewed and are negative.  EKGs/Labs/Other Studies Reviewed:    EKG:  EKG is  ordered today.  The ekg ordered today demonstrates normal sinus rhythm, QS pattern in leads V1-V2, no acute repolarization abnormalities, QTC 445 ms  Recent Labs: hemoglobin 13.9, glucose 92, creatinine 0.97, normal liver function tests Recent Lipid Panel Cholesterol 156, triglycerides 73, HDL 64, LDL  77  Physical Exam:    VS:  There were no vitals taken for this visit.    Wt Readings from Last 3 Encounters:  03/09/15 188 lb (85.3 kg)  02/08/14 179 lb 6.4 oz (81.4 kg)  11/28/11 175 lb (79.4 kg)     GEN: Well nourished, well developed in no acute distress HEENT: Normal NECK: No JVD; No carotid bruits LYMPHATICS: No lymphadenopathy CARDIAC: RRR, no murmurs, rubs, gallops RESPIRATORY:  Clear to auscultation without rales, wheezing or rhonchi  ABDOMEN: Soft, non-tender, non-distended MUSCULOSKELETAL:  No edema; No deformity  SKIN: Warm and dry NEUROLOGIC:  Alert and oriented x 3 PSYCHIATRIC:  Normal affect   ASSESSMENT:    No diagnosis found. PLAN:    In order of problems listed above:  1. Coronary vasospasm: He documented that he would like coronary angiography but without any chest pain in several years.  She does not currently take any vasodilator medications. 2. HLP: Lipid parameters are within desirable range on simvastatin. 3. HTN: Borderline control on current medications.  Encouraged weight loss and regular physical exercise. 4. RA/Sjogren's: Unclear if this is related to previous vasospasm (i.e:Raynauds syndrome).   Medication Adjustments/Labs and Tests Ordered: Current medicines are reviewed at length with the patient today.  Concerns regarding medicines are outlined above.  No orders of the defined types were placed in this encounter.  No orders of the defined types were placed in this encounter.   Signed, Sanda Klein, MD  04/24/2017 3:30 PM    [place]

## 2017-04-24 NOTE — Patient Instructions (Signed)
Dr Croitoru recommends that you schedule a follow-up appointment in 12 months. You will receive a reminder letter in the mail two months in advance. If you don't receive a letter, please call our office to schedule the follow-up appointment.  If you need a refill on your cardiac medications before your next appointment, please call your pharmacy. 

## 2017-04-25 DIAGNOSIS — I201 Angina pectoris with documented spasm: Secondary | ICD-10-CM | POA: Insufficient documentation

## 2017-05-14 DIAGNOSIS — H52223 Regular astigmatism, bilateral: Secondary | ICD-10-CM | POA: Diagnosis not present

## 2017-05-14 DIAGNOSIS — M3500 Sicca syndrome, unspecified: Secondary | ICD-10-CM | POA: Diagnosis not present

## 2017-05-14 DIAGNOSIS — M0570 Rheumatoid arthritis with rheumatoid factor of unspecified site without organ or systems involvement: Secondary | ICD-10-CM | POA: Diagnosis not present

## 2017-05-14 DIAGNOSIS — H2513 Age-related nuclear cataract, bilateral: Secondary | ICD-10-CM | POA: Diagnosis not present

## 2017-05-14 DIAGNOSIS — Z79899 Other long term (current) drug therapy: Secondary | ICD-10-CM | POA: Diagnosis not present

## 2017-05-14 DIAGNOSIS — H524 Presbyopia: Secondary | ICD-10-CM | POA: Diagnosis not present

## 2017-05-14 DIAGNOSIS — H04123 Dry eye syndrome of bilateral lacrimal glands: Secondary | ICD-10-CM | POA: Diagnosis not present

## 2017-05-14 DIAGNOSIS — D3131 Benign neoplasm of right choroid: Secondary | ICD-10-CM | POA: Diagnosis not present

## 2017-05-18 DIAGNOSIS — Z6832 Body mass index (BMI) 32.0-32.9, adult: Secondary | ICD-10-CM | POA: Diagnosis not present

## 2017-05-18 DIAGNOSIS — L509 Urticaria, unspecified: Secondary | ICD-10-CM | POA: Diagnosis not present

## 2017-05-22 DIAGNOSIS — L309 Dermatitis, unspecified: Secondary | ICD-10-CM | POA: Diagnosis not present

## 2017-05-22 DIAGNOSIS — L718 Other rosacea: Secondary | ICD-10-CM | POA: Diagnosis not present

## 2017-05-28 DIAGNOSIS — M0609 Rheumatoid arthritis without rheumatoid factor, multiple sites: Secondary | ICD-10-CM | POA: Diagnosis not present

## 2017-06-30 DIAGNOSIS — N390 Urinary tract infection, site not specified: Secondary | ICD-10-CM | POA: Diagnosis not present

## 2017-06-30 DIAGNOSIS — R300 Dysuria: Secondary | ICD-10-CM | POA: Diagnosis not present

## 2017-07-23 DIAGNOSIS — M0609 Rheumatoid arthritis without rheumatoid factor, multiple sites: Secondary | ICD-10-CM | POA: Diagnosis not present

## 2017-09-17 DIAGNOSIS — Z79899 Other long term (current) drug therapy: Secondary | ICD-10-CM | POA: Diagnosis not present

## 2017-09-17 DIAGNOSIS — M0609 Rheumatoid arthritis without rheumatoid factor, multiple sites: Secondary | ICD-10-CM | POA: Diagnosis not present

## 2017-10-15 DIAGNOSIS — Z6831 Body mass index (BMI) 31.0-31.9, adult: Secondary | ICD-10-CM | POA: Diagnosis not present

## 2017-10-15 DIAGNOSIS — M797 Fibromyalgia: Secondary | ICD-10-CM | POA: Diagnosis not present

## 2017-10-15 DIAGNOSIS — E669 Obesity, unspecified: Secondary | ICD-10-CM | POA: Diagnosis not present

## 2017-10-15 DIAGNOSIS — M3500 Sicca syndrome, unspecified: Secondary | ICD-10-CM | POA: Diagnosis not present

## 2017-10-15 DIAGNOSIS — Z79899 Other long term (current) drug therapy: Secondary | ICD-10-CM | POA: Diagnosis not present

## 2017-10-15 DIAGNOSIS — M0609 Rheumatoid arthritis without rheumatoid factor, multiple sites: Secondary | ICD-10-CM | POA: Diagnosis not present

## 2017-10-15 DIAGNOSIS — M2550 Pain in unspecified joint: Secondary | ICD-10-CM | POA: Diagnosis not present

## 2017-11-16 DIAGNOSIS — M0609 Rheumatoid arthritis without rheumatoid factor, multiple sites: Secondary | ICD-10-CM | POA: Diagnosis not present

## 2018-01-12 DIAGNOSIS — M0609 Rheumatoid arthritis without rheumatoid factor, multiple sites: Secondary | ICD-10-CM | POA: Diagnosis not present

## 2018-02-04 DIAGNOSIS — R82998 Other abnormal findings in urine: Secondary | ICD-10-CM | POA: Diagnosis not present

## 2018-02-04 DIAGNOSIS — I10 Essential (primary) hypertension: Secondary | ICD-10-CM | POA: Diagnosis not present

## 2018-02-11 DIAGNOSIS — I119 Hypertensive heart disease without heart failure: Secondary | ICD-10-CM | POA: Diagnosis not present

## 2018-02-11 DIAGNOSIS — M069 Rheumatoid arthritis, unspecified: Secondary | ICD-10-CM | POA: Diagnosis not present

## 2018-02-11 DIAGNOSIS — Z23 Encounter for immunization: Secondary | ICD-10-CM | POA: Diagnosis not present

## 2018-02-11 DIAGNOSIS — E7849 Other hyperlipidemia: Secondary | ICD-10-CM | POA: Diagnosis not present

## 2018-02-11 DIAGNOSIS — M797 Fibromyalgia: Secondary | ICD-10-CM | POA: Diagnosis not present

## 2018-02-11 DIAGNOSIS — D8989 Other specified disorders involving the immune mechanism, not elsewhere classified: Secondary | ICD-10-CM | POA: Diagnosis not present

## 2018-02-11 DIAGNOSIS — Z6830 Body mass index (BMI) 30.0-30.9, adult: Secondary | ICD-10-CM | POA: Diagnosis not present

## 2018-02-11 DIAGNOSIS — M3500 Sicca syndrome, unspecified: Secondary | ICD-10-CM | POA: Diagnosis not present

## 2018-02-11 DIAGNOSIS — R5383 Other fatigue: Secondary | ICD-10-CM | POA: Diagnosis not present

## 2018-02-11 DIAGNOSIS — I2510 Atherosclerotic heart disease of native coronary artery without angina pectoris: Secondary | ICD-10-CM | POA: Diagnosis not present

## 2018-02-11 DIAGNOSIS — Z0000 Encounter for general adult medical examination without abnormal findings: Secondary | ICD-10-CM | POA: Diagnosis not present

## 2018-02-11 DIAGNOSIS — Z1389 Encounter for screening for other disorder: Secondary | ICD-10-CM | POA: Diagnosis not present

## 2018-02-11 DIAGNOSIS — I493 Ventricular premature depolarization: Secondary | ICD-10-CM | POA: Diagnosis not present

## 2018-02-23 DIAGNOSIS — Z1212 Encounter for screening for malignant neoplasm of rectum: Secondary | ICD-10-CM | POA: Diagnosis not present

## 2018-03-09 DIAGNOSIS — M0609 Rheumatoid arthritis without rheumatoid factor, multiple sites: Secondary | ICD-10-CM | POA: Diagnosis not present

## 2018-03-09 DIAGNOSIS — Z79899 Other long term (current) drug therapy: Secondary | ICD-10-CM | POA: Diagnosis not present

## 2018-04-16 DIAGNOSIS — Z6830 Body mass index (BMI) 30.0-30.9, adult: Secondary | ICD-10-CM | POA: Diagnosis not present

## 2018-04-16 DIAGNOSIS — M0609 Rheumatoid arthritis without rheumatoid factor, multiple sites: Secondary | ICD-10-CM | POA: Diagnosis not present

## 2018-04-16 DIAGNOSIS — M797 Fibromyalgia: Secondary | ICD-10-CM | POA: Diagnosis not present

## 2018-04-16 DIAGNOSIS — Z79899 Other long term (current) drug therapy: Secondary | ICD-10-CM | POA: Diagnosis not present

## 2018-04-16 DIAGNOSIS — M2550 Pain in unspecified joint: Secondary | ICD-10-CM | POA: Diagnosis not present

## 2018-04-16 DIAGNOSIS — M3500 Sicca syndrome, unspecified: Secondary | ICD-10-CM | POA: Diagnosis not present

## 2018-04-16 DIAGNOSIS — E669 Obesity, unspecified: Secondary | ICD-10-CM | POA: Diagnosis not present

## 2018-05-06 DIAGNOSIS — M0609 Rheumatoid arthritis without rheumatoid factor, multiple sites: Secondary | ICD-10-CM | POA: Diagnosis not present

## 2018-05-19 DIAGNOSIS — Z79899 Other long term (current) drug therapy: Secondary | ICD-10-CM | POA: Diagnosis not present

## 2018-05-19 DIAGNOSIS — M0609 Rheumatoid arthritis without rheumatoid factor, multiple sites: Secondary | ICD-10-CM | POA: Diagnosis not present

## 2018-05-24 DIAGNOSIS — D3131 Benign neoplasm of right choroid: Secondary | ICD-10-CM | POA: Diagnosis not present

## 2018-05-24 DIAGNOSIS — M0570 Rheumatoid arthritis with rheumatoid factor of unspecified site without organ or systems involvement: Secondary | ICD-10-CM | POA: Diagnosis not present

## 2018-05-24 DIAGNOSIS — M3500 Sicca syndrome, unspecified: Secondary | ICD-10-CM | POA: Diagnosis not present

## 2018-05-24 DIAGNOSIS — H524 Presbyopia: Secondary | ICD-10-CM | POA: Diagnosis not present

## 2018-05-24 DIAGNOSIS — H2513 Age-related nuclear cataract, bilateral: Secondary | ICD-10-CM | POA: Diagnosis not present

## 2018-05-31 DIAGNOSIS — Z79899 Other long term (current) drug therapy: Secondary | ICD-10-CM | POA: Diagnosis not present

## 2018-05-31 DIAGNOSIS — M797 Fibromyalgia: Secondary | ICD-10-CM | POA: Diagnosis not present

## 2018-05-31 DIAGNOSIS — M542 Cervicalgia: Secondary | ICD-10-CM | POA: Diagnosis not present

## 2018-05-31 DIAGNOSIS — M3500 Sicca syndrome, unspecified: Secondary | ICD-10-CM | POA: Diagnosis not present

## 2018-05-31 DIAGNOSIS — M2550 Pain in unspecified joint: Secondary | ICD-10-CM | POA: Diagnosis not present

## 2018-05-31 DIAGNOSIS — R05 Cough: Secondary | ICD-10-CM | POA: Diagnosis not present

## 2018-05-31 DIAGNOSIS — M0609 Rheumatoid arthritis without rheumatoid factor, multiple sites: Secondary | ICD-10-CM | POA: Diagnosis not present

## 2018-06-29 DIAGNOSIS — M542 Cervicalgia: Secondary | ICD-10-CM | POA: Diagnosis not present

## 2018-07-06 DIAGNOSIS — M0609 Rheumatoid arthritis without rheumatoid factor, multiple sites: Secondary | ICD-10-CM | POA: Diagnosis not present

## 2018-07-22 DIAGNOSIS — M0609 Rheumatoid arthritis without rheumatoid factor, multiple sites: Secondary | ICD-10-CM | POA: Diagnosis not present

## 2018-07-22 DIAGNOSIS — M2550 Pain in unspecified joint: Secondary | ICD-10-CM | POA: Diagnosis not present

## 2018-07-22 DIAGNOSIS — Z6830 Body mass index (BMI) 30.0-30.9, adult: Secondary | ICD-10-CM | POA: Diagnosis not present

## 2018-07-22 DIAGNOSIS — M797 Fibromyalgia: Secondary | ICD-10-CM | POA: Diagnosis not present

## 2018-07-22 DIAGNOSIS — E669 Obesity, unspecified: Secondary | ICD-10-CM | POA: Diagnosis not present

## 2018-07-22 DIAGNOSIS — R05 Cough: Secondary | ICD-10-CM | POA: Diagnosis not present

## 2018-07-22 DIAGNOSIS — Z79899 Other long term (current) drug therapy: Secondary | ICD-10-CM | POA: Diagnosis not present

## 2018-07-22 DIAGNOSIS — M3500 Sicca syndrome, unspecified: Secondary | ICD-10-CM | POA: Diagnosis not present

## 2018-07-22 DIAGNOSIS — M542 Cervicalgia: Secondary | ICD-10-CM | POA: Diagnosis not present

## 2018-08-31 DIAGNOSIS — M0609 Rheumatoid arthritis without rheumatoid factor, multiple sites: Secondary | ICD-10-CM | POA: Diagnosis not present

## 2018-10-26 DIAGNOSIS — M797 Fibromyalgia: Secondary | ICD-10-CM | POA: Diagnosis not present

## 2018-10-26 DIAGNOSIS — M2550 Pain in unspecified joint: Secondary | ICD-10-CM | POA: Diagnosis not present

## 2018-10-26 DIAGNOSIS — M0609 Rheumatoid arthritis without rheumatoid factor, multiple sites: Secondary | ICD-10-CM | POA: Diagnosis not present

## 2018-10-26 DIAGNOSIS — M3500 Sicca syndrome, unspecified: Secondary | ICD-10-CM | POA: Diagnosis not present

## 2018-10-26 DIAGNOSIS — Z79899 Other long term (current) drug therapy: Secondary | ICD-10-CM | POA: Diagnosis not present

## 2018-10-28 DIAGNOSIS — M0609 Rheumatoid arthritis without rheumatoid factor, multiple sites: Secondary | ICD-10-CM | POA: Diagnosis not present

## 2018-11-18 DIAGNOSIS — R300 Dysuria: Secondary | ICD-10-CM | POA: Diagnosis not present

## 2018-11-26 DIAGNOSIS — R7989 Other specified abnormal findings of blood chemistry: Secondary | ICD-10-CM | POA: Diagnosis not present

## 2018-12-02 DIAGNOSIS — Z96641 Presence of right artificial hip joint: Secondary | ICD-10-CM | POA: Diagnosis not present

## 2018-12-02 DIAGNOSIS — Z471 Aftercare following joint replacement surgery: Secondary | ICD-10-CM | POA: Diagnosis not present

## 2018-12-02 DIAGNOSIS — M25551 Pain in right hip: Secondary | ICD-10-CM | POA: Diagnosis not present

## 2018-12-02 DIAGNOSIS — Z96653 Presence of artificial knee joint, bilateral: Secondary | ICD-10-CM | POA: Diagnosis not present

## 2018-12-16 DIAGNOSIS — Z1231 Encounter for screening mammogram for malignant neoplasm of breast: Secondary | ICD-10-CM | POA: Diagnosis not present

## 2018-12-16 DIAGNOSIS — Z6829 Body mass index (BMI) 29.0-29.9, adult: Secondary | ICD-10-CM | POA: Diagnosis not present

## 2018-12-16 DIAGNOSIS — G4700 Insomnia, unspecified: Secondary | ICD-10-CM | POA: Diagnosis not present

## 2018-12-16 DIAGNOSIS — Z01419 Encounter for gynecological examination (general) (routine) without abnormal findings: Secondary | ICD-10-CM | POA: Diagnosis not present

## 2018-12-23 DIAGNOSIS — M0609 Rheumatoid arthritis without rheumatoid factor, multiple sites: Secondary | ICD-10-CM | POA: Diagnosis not present

## 2019-02-07 DIAGNOSIS — Z23 Encounter for immunization: Secondary | ICD-10-CM | POA: Diagnosis not present

## 2019-02-07 DIAGNOSIS — E7849 Other hyperlipidemia: Secondary | ICD-10-CM | POA: Diagnosis not present

## 2019-02-09 DIAGNOSIS — R82998 Other abnormal findings in urine: Secondary | ICD-10-CM | POA: Diagnosis not present

## 2019-02-14 DIAGNOSIS — Z0000 Encounter for general adult medical examination without abnormal findings: Secondary | ICD-10-CM | POA: Diagnosis not present

## 2019-02-14 DIAGNOSIS — R5383 Other fatigue: Secondary | ICD-10-CM | POA: Diagnosis not present

## 2019-02-14 DIAGNOSIS — D8989 Other specified disorders involving the immune mechanism, not elsewhere classified: Secondary | ICD-10-CM | POA: Diagnosis not present

## 2019-02-14 DIAGNOSIS — F418 Other specified anxiety disorders: Secondary | ICD-10-CM | POA: Diagnosis not present

## 2019-02-14 DIAGNOSIS — M797 Fibromyalgia: Secondary | ICD-10-CM | POA: Diagnosis not present

## 2019-02-14 DIAGNOSIS — I2510 Atherosclerotic heart disease of native coronary artery without angina pectoris: Secondary | ICD-10-CM | POA: Diagnosis not present

## 2019-02-14 DIAGNOSIS — M3500 Sicca syndrome, unspecified: Secondary | ICD-10-CM | POA: Diagnosis not present

## 2019-02-14 DIAGNOSIS — M069 Rheumatoid arthritis, unspecified: Secondary | ICD-10-CM | POA: Diagnosis not present

## 2019-02-14 DIAGNOSIS — E785 Hyperlipidemia, unspecified: Secondary | ICD-10-CM | POA: Diagnosis not present

## 2019-02-14 DIAGNOSIS — I493 Ventricular premature depolarization: Secondary | ICD-10-CM | POA: Diagnosis not present

## 2019-02-14 DIAGNOSIS — I119 Hypertensive heart disease without heart failure: Secondary | ICD-10-CM | POA: Diagnosis not present

## 2019-02-14 DIAGNOSIS — G4700 Insomnia, unspecified: Secondary | ICD-10-CM | POA: Diagnosis not present

## 2019-02-17 DIAGNOSIS — Z1212 Encounter for screening for malignant neoplasm of rectum: Secondary | ICD-10-CM | POA: Diagnosis not present

## 2019-02-17 DIAGNOSIS — M0609 Rheumatoid arthritis without rheumatoid factor, multiple sites: Secondary | ICD-10-CM | POA: Diagnosis not present

## 2019-04-14 DIAGNOSIS — Z79899 Other long term (current) drug therapy: Secondary | ICD-10-CM | POA: Diagnosis not present

## 2019-04-14 DIAGNOSIS — M0609 Rheumatoid arthritis without rheumatoid factor, multiple sites: Secondary | ICD-10-CM | POA: Diagnosis not present

## 2019-04-25 DIAGNOSIS — N1831 Chronic kidney disease, stage 3a: Secondary | ICD-10-CM | POA: Diagnosis not present

## 2019-04-25 DIAGNOSIS — M2550 Pain in unspecified joint: Secondary | ICD-10-CM | POA: Diagnosis not present

## 2019-04-25 DIAGNOSIS — Z6829 Body mass index (BMI) 29.0-29.9, adult: Secondary | ICD-10-CM | POA: Diagnosis not present

## 2019-04-25 DIAGNOSIS — E663 Overweight: Secondary | ICD-10-CM | POA: Diagnosis not present

## 2019-04-25 DIAGNOSIS — M797 Fibromyalgia: Secondary | ICD-10-CM | POA: Diagnosis not present

## 2019-04-25 DIAGNOSIS — Z79899 Other long term (current) drug therapy: Secondary | ICD-10-CM | POA: Diagnosis not present

## 2019-04-25 DIAGNOSIS — M0609 Rheumatoid arthritis without rheumatoid factor, multiple sites: Secondary | ICD-10-CM | POA: Diagnosis not present

## 2019-04-25 DIAGNOSIS — M3500 Sicca syndrome, unspecified: Secondary | ICD-10-CM | POA: Diagnosis not present

## 2019-06-09 DIAGNOSIS — R7989 Other specified abnormal findings of blood chemistry: Secondary | ICD-10-CM | POA: Diagnosis not present

## 2019-06-09 DIAGNOSIS — M0609 Rheumatoid arthritis without rheumatoid factor, multiple sites: Secondary | ICD-10-CM | POA: Diagnosis not present

## 2019-06-10 ENCOUNTER — Ambulatory Visit: Payer: Medicare Other

## 2019-06-18 ENCOUNTER — Ambulatory Visit: Payer: Medicare Other | Attending: Internal Medicine

## 2019-06-18 DIAGNOSIS — Z23 Encounter for immunization: Secondary | ICD-10-CM | POA: Insufficient documentation

## 2019-06-18 NOTE — Progress Notes (Signed)
   Covid-19 Vaccination Clinic  Name:  Ashley Hoover    MRN: [id number] DOB: [birth year]  06/18/2019  Ashley Hoover was observed post Covid-19 immunization for 15 minutes without incidence. She was provided with Vaccine Information Sheet and instruction to access the V-Safe system.   Ashley Hoover was instructed to call 911 with any severe reactions post vaccine: Marland Kitchen Difficulty breathing  . Swelling of your face and throat  . A fast heartbeat  . A bad rash all over your body  . Dizziness and weakness    Immunizations Administered    Name Date Dose VIS Date Route   Pfizer COVID-19 Vaccine 06/18/2019  2:41 PM 0.3 mL 04/22/2019 Intramuscular   Manufacturer: [place]   Lot: [id number]   [place]: [id number]

## 2019-06-27 ENCOUNTER — Ambulatory Visit: Payer: Medicare Other

## 2019-07-13 ENCOUNTER — Ambulatory Visit: Payer: Medicare Other | Attending: Internal Medicine

## 2019-07-13 DIAGNOSIS — Z23 Encounter for immunization: Secondary | ICD-10-CM | POA: Insufficient documentation

## 2019-07-13 NOTE — Progress Notes (Signed)
   Covid-19 Vaccination Clinic  Name:  Ashley Hoover    MRN: [id number] DOB: Aug 19, 1950  07/13/2019  [name] was observed post Covid-19 immunization for 15 minutes without incident. She was provided with Vaccine Information Sheet and instruction to access the V-Safe system.   [name] was instructed to call 911 with any severe reactions post vaccine: Marland Kitchen Difficulty breathing  . Swelling of face and throat  . A fast heartbeat  . A bad rash all over body  . Dizziness and weakness   Immunizations Administered    Name Date Dose VIS Date Route   Pfizer COVID-19 Vaccine 07/13/2019  1:50 PM 0.3 mL 04/22/2019 Intramuscular   Manufacturer: [place]   Lot: [id number]   [place]: [id number]

## 2019-08-04 DIAGNOSIS — M0609 Rheumatoid arthritis without rheumatoid factor, multiple sites: Secondary | ICD-10-CM | POA: Diagnosis not present

## 2019-09-29 DIAGNOSIS — Z79899 Other long term (current) drug therapy: Secondary | ICD-10-CM | POA: Diagnosis not present

## 2019-09-29 DIAGNOSIS — M0609 Rheumatoid arthritis without rheumatoid factor, multiple sites: Secondary | ICD-10-CM | POA: Diagnosis not present

## 2019-10-24 DIAGNOSIS — F418 Other specified anxiety disorders: Secondary | ICD-10-CM | POA: Diagnosis not present

## 2019-10-24 DIAGNOSIS — Z6829 Body mass index (BMI) 29.0-29.9, adult: Secondary | ICD-10-CM | POA: Diagnosis not present

## 2019-10-24 DIAGNOSIS — Z79899 Other long term (current) drug therapy: Secondary | ICD-10-CM | POA: Diagnosis not present

## 2019-10-24 DIAGNOSIS — M0609 Rheumatoid arthritis without rheumatoid factor, multiple sites: Secondary | ICD-10-CM | POA: Diagnosis not present

## 2019-10-24 DIAGNOSIS — M3500 Sicca syndrome, unspecified: Secondary | ICD-10-CM | POA: Diagnosis not present

## 2019-10-24 DIAGNOSIS — M797 Fibromyalgia: Secondary | ICD-10-CM | POA: Diagnosis not present

## 2019-10-24 DIAGNOSIS — M2550 Pain in unspecified joint: Secondary | ICD-10-CM | POA: Diagnosis not present

## 2019-10-24 DIAGNOSIS — E663 Overweight: Secondary | ICD-10-CM | POA: Diagnosis not present

## 2020-01-04 ENCOUNTER — Ambulatory Visit (INDEPENDENT_AMBULATORY_CARE_PROVIDER_SITE_OTHER): Payer: Medicare Other | Admitting: Cardiovascular Disease

## 2020-01-04 ENCOUNTER — Encounter: Payer: Self-pay | Admitting: Cardiovascular Disease

## 2020-01-04 ENCOUNTER — Other Ambulatory Visit: Payer: Self-pay

## 2020-01-04 VITALS — BP 131/70 | HR 76 | Ht 64.5 in | Wt 169.4 lb

## 2020-01-04 DIAGNOSIS — M0609 Rheumatoid arthritis without rheumatoid factor, multiple sites: Secondary | ICD-10-CM

## 2020-01-04 DIAGNOSIS — I201 Angina pectoris with documented spasm: Secondary | ICD-10-CM

## 2020-01-04 DIAGNOSIS — I10 Essential (primary) hypertension: Secondary | ICD-10-CM

## 2020-01-04 DIAGNOSIS — E7800 Pure hypercholesterolemia, unspecified: Secondary | ICD-10-CM

## 2020-01-04 DIAGNOSIS — I7300 Raynaud's syndrome without gangrene: Secondary | ICD-10-CM

## 2020-01-04 NOTE — Patient Instructions (Signed)
Medication Instructions:  No changes *If you need a refill on your cardiac medications before your next appointment, please call your pharmacy*   Lab Work: None ordered If you have labs (blood work) drawn today and your tests are completely normal, you will receive your results only by: Marland Kitchen MyChart Message (if you have MyChart) OR . A paper copy in the mail If you have any lab test that is abnormal or we need to change your treatment, we will call you to review the results.   Testing/Procedures: None ordered   Follow-Up: At [facility], you and your health needs are our priority.  As part of our continuing mission to provide you with exceptional heart care, we have created designated Provider Care Teams.  These Care Teams include your primary Cardiologist (physician) and Advanced Practice Providers (APPs -  Physician Assistants and Nurse Practitioners) who all work together to provide you with the care you need, when you need it.  We recommend signing up for the patient portal called "MyChart".  Sign up information is provided on this After Visit Summary.  MyChart is used to connect with patients for Virtual Visits (Telemedicine).  Patients are able to view lab/test results, encounter notes, upcoming appointments, etc.  Non-urgent messages can be sent to your provider as well.   To learn more about what you can do with MyChart, go to [email or web address].    Your next appointment:   2 year(s)  The format for your next appointment:   In Person  Provider:   You may see Sanda Klein, MD or one of the following Advanced Practice Providers on your designated Care Team:    Almyra Deforest, PA-C  Fabian Sharp, PA-C or   Roby Lofts, [place]

## 2020-01-04 NOTE — Progress Notes (Signed)
Cardiology Office Note:    Date:  01/05/2020   ID:  Ashley Hoover, DOB 08-01-1950, MRN [id number]  PCP:  Shon Baton, MD  Cardiologist:  Sanda Klein, MD    Referring MD: Shon Baton, MD   Chief complaint: Follow-up HTN, hyperlipidemia and family history of coronary disease  History of Present Illness:    Ashley Hoover is a 69 y.o. female with a hx of HTN and hyperlipidemia returning for follow-up.  She has symptoms of Raynaud's syndrome and a history of coronary vasospasm, but neither 1 of these has been a major complaint despite the fact that she is not taking any vasodilators at this time.  She does easily develop cold pale fingers, but mostly the phenomenon has involved her toes recently.  She has not had problems with ulcerations or gangrene.  She denies any angina at rest or with activity or during cold weather.  She is compliant with statin therapy and her most recent LDL cholesterol was 69.  Her complaints mostly revolve around musculoskeletal/joint issues related to rheumatoid arthritis and Sjogren's syndrome, although Simponi Aria seems to be working well for her (her rheumatologist is Dr. Lenna Gilford).   She does not have rheumatoid factor.  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  2008 she had a cardiac catheterization that showed very mild coronary atherosclerosis but also showed vasospasm in the proximal right coronary artery.  She has not had problems with chest pain in several years.  In the past she had frequent PVCs which are asymptomatic.   Past Medical History:  Diagnosis Date  . Anemia    hx of   .  Anginal pain ([place])    xh of 4 years ago   . Anxiety   . Arthritis   . Depression   . Dysrhythmia   . Fibromyalgia   . Headache(784.0)    occasional   . Hypertension   . Peripheral vascular disease [facility])     Past Surgical History:  Procedure Laterality Date  . APPENDECTOMY     age 20  . CARPAL TUNNEL RELEASE     right   . JOINT REPLACEMENT     bilateral knee replacements   . TOTAL HIP ARTHROPLASTY  11/28/2011   Procedure: TOTAL HIP ARTHROPLASTY;  Surgeon: Gearlean Alf, MD;  Location: [facility];  Service: Orthopedics;  Laterality: Right;  . VEIN LIGATION AND STRIPPING     age 19     Current Medications: Current Meds  Medication Sig  . acetaminophen (TYLENOL) 500 MG tablet Take 500 mg by mouth every 6 (six) hours as needed (pain).   Marland Kitchen aspirin 81 MG tablet Take 81 mg by mouth daily.  . Cholecalciferol (VITAMIN D-3 PO) Take 2,000 mg by mouth daily.  . DULoxetine (CYMBALTA) 60 MG capsule Take 60 mg by mouth at bedtime.   . Golimumab (Rhodes ARIA IV) Inject into the vein as directed.  . hydroxychloroquine (PLAQUENIL) 200 MG tablet Take 200 mg by mouth 2 (two) times daily.  . Melatonin 3 MG CAPS Take 1 capsule by mouth daily.  . Multiple Vitamins-Minerals (HAIR SKIN AND NAILS FORMULA) TABS Take 1 tablet by mouth 3 (three) times daily.  Vladimir Faster Glycol-Propyl Glycol  0.4-0.3 % SOLN Apply to eye as directed.   . polyethylene glycol (MIRALAX / GLYCOLAX) packet Take 17 g by mouth daily. Pt takes in am  . simvastatin (ZOCOR) 40 MG tablet Take 40 mg by mouth daily.  Marland Kitchen triamterene-hydrochlorothiazide (MAXZIDE) 75-50 MG per tablet Take 1 tablet by mouth daily with breakfast.  . [DISCONTINUED] zolpidem (AMBIEN) 10 MG tablet Take 10 mg by mouth at bedtime as needed. For sleep     Allergies:   Methotrexate derivatives, Penicillins, and Restasis [cyclosporine]   Social History   Socioeconomic History  . Marital status: Married    Spouse name: Not on file  . Number of children: Not on  file  . Years of education: Not on file  . Highest education level: Not on file  Occupational History  . Not on file  Tobacco Use  . Smoking status: Never Smoker  . Smokeless tobacco: Never Used  Substance and Sexual Activity  . Alcohol use: Yes    Alcohol/week: 1.0 standard drink    Types: 1 Glasses of wine per week  . Drug use: No  . Sexual activity: Not on file  Other Topics Concern  . Not on file  Social History Narrative  . Not on file   Social Determinants of Health   Financial Resource Strain:   . Difficulty of Paying Living Expenses: Not on file  Food Insecurity:   . Worried About Charity fundraiser in the Last Year: Not on file  . Ran Out of Food in the Last Year: Not on file  Transportation Needs:   . Lack of Transportation (Medical): Not on file  . Lack of Transportation (Non-Medical): Not on file  Physical Activity:   . Days of Exercise per Week: Not on file  . Minutes of Exercise per Session: Not on file  Stress:   . Feeling of Stress : Not on file  Social Connections:   . Frequency of Communication with Friends and Family: Not on file  . Frequency of Social Gatherings with Friends and Family: Not on file  . Attends Religious Services: Not on file  . Active Member of Clubs or Organizations: Not on file  . Attends Archivist Meetings: Not on file  . Marital Status: Not on file     Family History: The patient's Family history significantly negative for premature coronary or peripheral vascular disease ROS:   Please see the history of present illness.    All other systems are reviewed and are negative.   EKGs/Labs/Other Studies Reviewed:    EKG:  EKG is  ordered today.  It shows normal sinus rhythm with a QS pattern in leads V1-V2 (old) but no repolarization abnormalities.  Borderline QTC 477 ms.  Recent Labs: 09/29/2019 Hemoglobin 14.5, creatinine 1.24, normal liver function tests Recent Lipid Panel 02/07/2019 cholesterol 156,  triglycerides 119, HDL 63, LDL 69  Physical Exam:    VS:  BP 131/70   Pulse 76   Ht 5' 4.5" (1.638 m)   Wt 169 lb 6.4 oz (76.8 kg)   SpO2 96%   BMI 28.63 kg/m     Wt Readings from Last 3 Encounters:  01/04/20 169 lb 6.4 oz (76.8 kg)  04/24/17 181 lb (82.1 kg)  03/09/15 188 lb (85.3 kg)    General: Alert, oriented x3, no distress, appears well Head: no evidence of trauma, PERRL, EOMI, no exophtalmos or lid lag, no myxedema, no xanthelasma; normal ears, nose and oropharynx Neck: normal jugular venous pulsations  and no hepatojugular reflux; brisk carotid pulses without delay and no carotid bruits Chest: clear to auscultation, no signs of consolidation by percussion or palpation, normal fremitus, symmetrical and full respiratory excursions Cardiovascular: normal position and quality of the apical impulse, regular rhythm, normal first and second heart sounds, no murmurs, rubs or gallops Abdomen: no tenderness or distention, no masses by palpation, no abnormal pulsatility or arterial bruits, normal bowel sounds, no hepatosplenomegaly Extremities: no clubbing, cyanosis or edema; 2+ radial, ulnar and brachial pulses bilaterally; 2+ right femoral, posterior tibial and dorsalis pedis pulses; 2+ left femoral, posterior tibial and dorsalis pedis pulses; no subclavian or femoral bruits.  She has fingers changes consistent with both rheumatoid and osteoarthritis. Neurological: grossly nonfocal Psych: Normal mood and affect   ASSESSMENT:    1. Essential hypertension   2. Coronary vasospasm (HCC)   3. Hypercholesterolemia   4. Rheumatoid arthritis of multiple sites with negative rheumatoid factor (HCC)   5. Raynaud's disease without gangrene    PLAN:    In order of problems listed above:  1. Coronary vasospasm: Documented that coronary angiography, but asymptomatic for several years, without need for vasodilator medication since she started treatment with antirheumatic drugs. 2. HLP: At  target on statin. 3. HTN: Well-controlled, especially since she has lost almost 20 pounds in the last for 5 years. 4. RA/Sjogren's: Has some features of Raynaud's syndrome currently mostly involving her toes, less prominent in her fingers.  On combination hydroxychloroquine and golimumab.   Medication Adjustments/Labs and Tests Ordered: Current medicines are reviewed at length with the patient today.  Concerns regarding medicines are outlined above.  Orders Placed This Encounter  Procedures  . EKG 12-Lead   No orders of the defined types were placed in this encounter.   Signed, Sanda Klein, MD  01/05/2020 2:39 PM    [place]

## 2020-01-19 DIAGNOSIS — M0609 Rheumatoid arthritis without rheumatoid factor, multiple sites: Secondary | ICD-10-CM | POA: Diagnosis not present

## 2020-01-31 DIAGNOSIS — Z6829 Body mass index (BMI) 29.0-29.9, adult: Secondary | ICD-10-CM | POA: Diagnosis not present

## 2020-01-31 DIAGNOSIS — Z01419 Encounter for gynecological examination (general) (routine) without abnormal findings: Secondary | ICD-10-CM | POA: Diagnosis not present

## 2020-02-09 DIAGNOSIS — I10 Essential (primary) hypertension: Secondary | ICD-10-CM | POA: Diagnosis not present

## 2020-02-09 DIAGNOSIS — E785 Hyperlipidemia, unspecified: Secondary | ICD-10-CM | POA: Diagnosis not present

## 2020-02-16 DIAGNOSIS — G4700 Insomnia, unspecified: Secondary | ICD-10-CM | POA: Diagnosis not present

## 2020-02-16 DIAGNOSIS — M069 Rheumatoid arthritis, unspecified: Secondary | ICD-10-CM | POA: Diagnosis not present

## 2020-02-16 DIAGNOSIS — Z0000 Encounter for general adult medical examination without abnormal findings: Secondary | ICD-10-CM | POA: Diagnosis not present

## 2020-02-16 DIAGNOSIS — D8989 Other specified disorders involving the immune mechanism, not elsewhere classified: Secondary | ICD-10-CM | POA: Diagnosis not present

## 2020-02-16 DIAGNOSIS — E785 Hyperlipidemia, unspecified: Secondary | ICD-10-CM | POA: Diagnosis not present

## 2020-02-16 DIAGNOSIS — R5383 Other fatigue: Secondary | ICD-10-CM | POA: Diagnosis not present

## 2020-02-16 DIAGNOSIS — M542 Cervicalgia: Secondary | ICD-10-CM | POA: Diagnosis not present

## 2020-02-16 DIAGNOSIS — I119 Hypertensive heart disease without heart failure: Secondary | ICD-10-CM | POA: Diagnosis not present

## 2020-02-16 DIAGNOSIS — F419 Anxiety disorder, unspecified: Secondary | ICD-10-CM | POA: Diagnosis not present

## 2020-02-16 DIAGNOSIS — M1990 Unspecified osteoarthritis, unspecified site: Secondary | ICD-10-CM | POA: Diagnosis not present

## 2020-02-16 DIAGNOSIS — I493 Ventricular premature depolarization: Secondary | ICD-10-CM | POA: Diagnosis not present

## 2020-02-16 DIAGNOSIS — I2510 Atherosclerotic heart disease of native coronary artery without angina pectoris: Secondary | ICD-10-CM | POA: Diagnosis not present

## 2020-03-01 DIAGNOSIS — K921 Melena: Secondary | ICD-10-CM | POA: Diagnosis not present

## 2020-03-12 DIAGNOSIS — K921 Melena: Secondary | ICD-10-CM | POA: Diagnosis not present

## 2020-03-15 DIAGNOSIS — M0609 Rheumatoid arthritis without rheumatoid factor, multiple sites: Secondary | ICD-10-CM | POA: Diagnosis not present

## 2020-03-15 DIAGNOSIS — Z79899 Other long term (current) drug therapy: Secondary | ICD-10-CM | POA: Diagnosis not present

## 2020-03-23 DIAGNOSIS — R634 Abnormal weight loss: Secondary | ICD-10-CM | POA: Diagnosis not present

## 2020-03-23 DIAGNOSIS — Z1159 Encounter for screening for other viral diseases: Secondary | ICD-10-CM | POA: Diagnosis not present

## 2020-03-23 DIAGNOSIS — K5904 Chronic idiopathic constipation: Secondary | ICD-10-CM | POA: Diagnosis not present

## 2020-03-23 DIAGNOSIS — R195 Other fecal abnormalities: Secondary | ICD-10-CM | POA: Diagnosis not present

## 2020-03-27 DIAGNOSIS — D122 Benign neoplasm of ascending colon: Secondary | ICD-10-CM | POA: Diagnosis not present

## 2020-03-27 DIAGNOSIS — R195 Other fecal abnormalities: Secondary | ICD-10-CM | POA: Diagnosis not present

## 2020-03-27 DIAGNOSIS — K921 Melena: Secondary | ICD-10-CM | POA: Diagnosis not present

## 2020-03-30 DIAGNOSIS — D122 Benign neoplasm of ascending colon: Secondary | ICD-10-CM | POA: Diagnosis not present

## 2020-04-17 DIAGNOSIS — Z23 Encounter for immunization: Secondary | ICD-10-CM | POA: Diagnosis not present

## 2020-04-24 DIAGNOSIS — M2550 Pain in unspecified joint: Secondary | ICD-10-CM | POA: Diagnosis not present

## 2020-04-24 DIAGNOSIS — M0609 Rheumatoid arthritis without rheumatoid factor, multiple sites: Secondary | ICD-10-CM | POA: Diagnosis not present

## 2020-04-24 DIAGNOSIS — M797 Fibromyalgia: Secondary | ICD-10-CM | POA: Diagnosis not present

## 2020-04-24 DIAGNOSIS — Z6828 Body mass index (BMI) 28.0-28.9, adult: Secondary | ICD-10-CM | POA: Diagnosis not present

## 2020-04-24 DIAGNOSIS — E663 Overweight: Secondary | ICD-10-CM | POA: Diagnosis not present

## 2020-04-24 DIAGNOSIS — F418 Other specified anxiety disorders: Secondary | ICD-10-CM | POA: Diagnosis not present

## 2020-04-24 DIAGNOSIS — R5382 Chronic fatigue, unspecified: Secondary | ICD-10-CM | POA: Diagnosis not present

## 2020-04-24 DIAGNOSIS — Z79899 Other long term (current) drug therapy: Secondary | ICD-10-CM | POA: Diagnosis not present

## 2020-04-24 DIAGNOSIS — M3500 Sicca syndrome, unspecified: Secondary | ICD-10-CM | POA: Diagnosis not present

## 2020-05-10 DIAGNOSIS — M0609 Rheumatoid arthritis without rheumatoid factor, multiple sites: Secondary | ICD-10-CM | POA: Diagnosis not present

## 2020-05-10 DIAGNOSIS — Z79899 Other long term (current) drug therapy: Secondary | ICD-10-CM | POA: Diagnosis not present

## 2020-07-05 DIAGNOSIS — M0609 Rheumatoid arthritis without rheumatoid factor, multiple sites: Secondary | ICD-10-CM | POA: Diagnosis not present

## 2020-07-18 DIAGNOSIS — H524 Presbyopia: Secondary | ICD-10-CM | POA: Diagnosis not present

## 2020-07-18 DIAGNOSIS — D3131 Benign neoplasm of right choroid: Secondary | ICD-10-CM | POA: Diagnosis not present

## 2020-07-18 DIAGNOSIS — H52223 Regular astigmatism, bilateral: Secondary | ICD-10-CM | POA: Diagnosis not present

## 2020-07-18 DIAGNOSIS — H16223 Keratoconjunctivitis sicca, not specified as Sjogren's, bilateral: Secondary | ICD-10-CM | POA: Diagnosis not present

## 2020-07-18 DIAGNOSIS — H2513 Age-related nuclear cataract, bilateral: Secondary | ICD-10-CM | POA: Diagnosis not present

## 2020-07-18 DIAGNOSIS — M0570 Rheumatoid arthritis with rheumatoid factor of unspecified site without organ or systems involvement: Secondary | ICD-10-CM | POA: Diagnosis not present

## 2020-07-18 DIAGNOSIS — M3500 Sicca syndrome, unspecified: Secondary | ICD-10-CM | POA: Diagnosis not present

## 2020-09-06 DIAGNOSIS — M0609 Rheumatoid arthritis without rheumatoid factor, multiple sites: Secondary | ICD-10-CM | POA: Diagnosis not present

## 2020-09-28 DIAGNOSIS — Z23 Encounter for immunization: Secondary | ICD-10-CM | POA: Diagnosis not present

## 2020-10-23 DIAGNOSIS — M797 Fibromyalgia: Secondary | ICD-10-CM | POA: Diagnosis not present

## 2020-10-23 DIAGNOSIS — E663 Overweight: Secondary | ICD-10-CM | POA: Diagnosis not present

## 2020-10-23 DIAGNOSIS — M0609 Rheumatoid arthritis without rheumatoid factor, multiple sites: Secondary | ICD-10-CM | POA: Diagnosis not present

## 2020-10-23 DIAGNOSIS — Z6827 Body mass index (BMI) 27.0-27.9, adult: Secondary | ICD-10-CM | POA: Diagnosis not present

## 2020-10-23 DIAGNOSIS — M2550 Pain in unspecified joint: Secondary | ICD-10-CM | POA: Diagnosis not present

## 2020-10-23 DIAGNOSIS — M3500 Sicca syndrome, unspecified: Secondary | ICD-10-CM | POA: Diagnosis not present

## 2020-10-23 DIAGNOSIS — M159 Polyosteoarthritis, unspecified: Secondary | ICD-10-CM | POA: Diagnosis not present

## 2020-10-23 DIAGNOSIS — Z79899 Other long term (current) drug therapy: Secondary | ICD-10-CM | POA: Diagnosis not present

## 2020-11-01 DIAGNOSIS — M0609 Rheumatoid arthritis without rheumatoid factor, multiple sites: Secondary | ICD-10-CM | POA: Diagnosis not present

## 2020-12-27 DIAGNOSIS — M0609 Rheumatoid arthritis without rheumatoid factor, multiple sites: Secondary | ICD-10-CM | POA: Diagnosis not present

## 2021-02-05 DIAGNOSIS — L309 Dermatitis, unspecified: Secondary | ICD-10-CM | POA: Diagnosis not present

## 2021-02-11 DIAGNOSIS — Z23 Encounter for immunization: Secondary | ICD-10-CM | POA: Diagnosis not present

## 2021-02-15 DIAGNOSIS — I119 Hypertensive heart disease without heart failure: Secondary | ICD-10-CM | POA: Diagnosis not present

## 2021-02-15 DIAGNOSIS — E785 Hyperlipidemia, unspecified: Secondary | ICD-10-CM | POA: Diagnosis not present

## 2021-02-28 DIAGNOSIS — R82998 Other abnormal findings in urine: Secondary | ICD-10-CM | POA: Diagnosis not present

## 2021-02-28 DIAGNOSIS — Z0000 Encounter for general adult medical examination without abnormal findings: Secondary | ICD-10-CM | POA: Diagnosis not present

## 2021-02-28 DIAGNOSIS — D8989 Other specified disorders involving the immune mechanism, not elsewhere classified: Secondary | ICD-10-CM | POA: Diagnosis not present

## 2021-02-28 DIAGNOSIS — G4700 Insomnia, unspecified: Secondary | ICD-10-CM | POA: Diagnosis not present

## 2021-02-28 DIAGNOSIS — M069 Rheumatoid arthritis, unspecified: Secondary | ICD-10-CM | POA: Diagnosis not present

## 2021-02-28 DIAGNOSIS — I119 Hypertensive heart disease without heart failure: Secondary | ICD-10-CM | POA: Diagnosis not present

## 2021-02-28 DIAGNOSIS — Z1212 Encounter for screening for malignant neoplasm of rectum: Secondary | ICD-10-CM | POA: Diagnosis not present

## 2021-02-28 DIAGNOSIS — M3500 Sicca syndrome, unspecified: Secondary | ICD-10-CM | POA: Diagnosis not present

## 2021-02-28 DIAGNOSIS — M0609 Rheumatoid arthritis without rheumatoid factor, multiple sites: Secondary | ICD-10-CM | POA: Diagnosis not present

## 2021-02-28 DIAGNOSIS — R5383 Other fatigue: Secondary | ICD-10-CM | POA: Diagnosis not present

## 2021-02-28 DIAGNOSIS — F900 Attention-deficit hyperactivity disorder, predominantly inattentive type: Secondary | ICD-10-CM | POA: Diagnosis not present

## 2021-02-28 DIAGNOSIS — F419 Anxiety disorder, unspecified: Secondary | ICD-10-CM | POA: Diagnosis not present

## 2021-02-28 DIAGNOSIS — E785 Hyperlipidemia, unspecified: Secondary | ICD-10-CM | POA: Diagnosis not present

## 2021-02-28 DIAGNOSIS — I2510 Atherosclerotic heart disease of native coronary artery without angina pectoris: Secondary | ICD-10-CM | POA: Diagnosis not present

## 2021-02-28 DIAGNOSIS — M1990 Unspecified osteoarthritis, unspecified site: Secondary | ICD-10-CM | POA: Diagnosis not present

## 2021-02-28 DIAGNOSIS — I10 Essential (primary) hypertension: Secondary | ICD-10-CM | POA: Diagnosis not present

## 2021-03-19 DIAGNOSIS — Z6826 Body mass index (BMI) 26.0-26.9, adult: Secondary | ICD-10-CM | POA: Diagnosis not present

## 2021-03-19 DIAGNOSIS — Z1231 Encounter for screening mammogram for malignant neoplasm of breast: Secondary | ICD-10-CM | POA: Diagnosis not present

## 2021-03-19 DIAGNOSIS — Z01419 Encounter for gynecological examination (general) (routine) without abnormal findings: Secondary | ICD-10-CM | POA: Diagnosis not present

## 2021-03-28 DIAGNOSIS — Z23 Encounter for immunization: Secondary | ICD-10-CM | POA: Diagnosis not present

## 2021-04-01 DIAGNOSIS — R300 Dysuria: Secondary | ICD-10-CM | POA: Diagnosis not present

## 2021-04-03 ENCOUNTER — Other Ambulatory Visit: Payer: Self-pay | Admitting: Internal Medicine

## 2021-04-03 ENCOUNTER — Ambulatory Visit
Admission: RE | Admit: 2021-04-03 | Discharge: 2021-04-03 | Disposition: A | Discharge status: Home / Self Care | Payer: Medicare Other | Source: Ambulatory Visit | Attending: Internal Medicine | Admitting: Internal Medicine

## 2021-04-03 DIAGNOSIS — N200 Calculus of kidney: Secondary | ICD-10-CM | POA: Diagnosis not present

## 2021-04-03 DIAGNOSIS — R8290 Unspecified abnormal findings in urine: Secondary | ICD-10-CM | POA: Diagnosis not present

## 2021-04-03 DIAGNOSIS — R5383 Other fatigue: Secondary | ICD-10-CM | POA: Diagnosis not present

## 2021-04-15 DIAGNOSIS — N3941 Urge incontinence: Secondary | ICD-10-CM | POA: Diagnosis not present

## 2021-04-24 DIAGNOSIS — M0609 Rheumatoid arthritis without rheumatoid factor, multiple sites: Secondary | ICD-10-CM | POA: Diagnosis not present

## 2021-04-24 DIAGNOSIS — E663 Overweight: Secondary | ICD-10-CM | POA: Diagnosis not present

## 2021-04-24 DIAGNOSIS — M542 Cervicalgia: Secondary | ICD-10-CM | POA: Diagnosis not present

## 2021-04-24 DIAGNOSIS — M159 Polyosteoarthritis, unspecified: Secondary | ICD-10-CM | POA: Diagnosis not present

## 2021-04-24 DIAGNOSIS — M797 Fibromyalgia: Secondary | ICD-10-CM | POA: Diagnosis not present

## 2021-04-24 DIAGNOSIS — R5382 Chronic fatigue, unspecified: Secondary | ICD-10-CM | POA: Diagnosis not present

## 2021-04-24 DIAGNOSIS — Z6828 Body mass index (BMI) 28.0-28.9, adult: Secondary | ICD-10-CM | POA: Diagnosis not present

## 2021-04-24 DIAGNOSIS — Z79899 Other long term (current) drug therapy: Secondary | ICD-10-CM | POA: Diagnosis not present

## 2021-04-24 DIAGNOSIS — M3500 Sicca syndrome, unspecified: Secondary | ICD-10-CM | POA: Diagnosis not present

## 2021-04-25 DIAGNOSIS — M0609 Rheumatoid arthritis without rheumatoid factor, multiple sites: Secondary | ICD-10-CM | POA: Diagnosis not present

## 2021-05-29 DIAGNOSIS — Z1212 Encounter for screening for malignant neoplasm of rectum: Secondary | ICD-10-CM | POA: Diagnosis not present

## 2021-05-30 DIAGNOSIS — R311 Benign essential microscopic hematuria: Secondary | ICD-10-CM | POA: Diagnosis not present

## 2021-06-20 DIAGNOSIS — Z79899 Other long term (current) drug therapy: Secondary | ICD-10-CM | POA: Diagnosis not present

## 2021-06-20 DIAGNOSIS — M0609 Rheumatoid arthritis without rheumatoid factor, multiple sites: Secondary | ICD-10-CM | POA: Diagnosis not present

## 2021-06-20 DIAGNOSIS — R5383 Other fatigue: Secondary | ICD-10-CM | POA: Diagnosis not present

## 2021-06-20 DIAGNOSIS — Z111 Encounter for screening for respiratory tuberculosis: Secondary | ICD-10-CM | POA: Diagnosis not present

## 2021-07-11 DIAGNOSIS — R311 Benign essential microscopic hematuria: Secondary | ICD-10-CM | POA: Diagnosis not present

## 2021-07-22 DIAGNOSIS — H2513 Age-related nuclear cataract, bilateral: Secondary | ICD-10-CM | POA: Diagnosis not present

## 2021-07-22 DIAGNOSIS — Z79899 Other long term (current) drug therapy: Secondary | ICD-10-CM | POA: Diagnosis not present

## 2021-07-22 DIAGNOSIS — D3131 Benign neoplasm of right choroid: Secondary | ICD-10-CM | POA: Diagnosis not present

## 2021-08-19 DIAGNOSIS — M0609 Rheumatoid arthritis without rheumatoid factor, multiple sites: Secondary | ICD-10-CM | POA: Diagnosis not present

## 2021-08-19 DIAGNOSIS — Z79899 Other long term (current) drug therapy: Secondary | ICD-10-CM | POA: Diagnosis not present

## 2021-10-14 DIAGNOSIS — M0609 Rheumatoid arthritis without rheumatoid factor, multiple sites: Secondary | ICD-10-CM | POA: Diagnosis not present

## 2021-10-14 DIAGNOSIS — Z79899 Other long term (current) drug therapy: Secondary | ICD-10-CM | POA: Diagnosis not present

## 2021-10-23 DIAGNOSIS — E663 Overweight: Secondary | ICD-10-CM | POA: Diagnosis not present

## 2021-10-23 DIAGNOSIS — M1991 Primary osteoarthritis, unspecified site: Secondary | ICD-10-CM | POA: Diagnosis not present

## 2021-10-23 DIAGNOSIS — M797 Fibromyalgia: Secondary | ICD-10-CM | POA: Diagnosis not present

## 2021-10-23 DIAGNOSIS — Z79899 Other long term (current) drug therapy: Secondary | ICD-10-CM | POA: Diagnosis not present

## 2021-10-23 DIAGNOSIS — M0609 Rheumatoid arthritis without rheumatoid factor, multiple sites: Secondary | ICD-10-CM | POA: Diagnosis not present

## 2021-10-23 DIAGNOSIS — Z6827 Body mass index (BMI) 27.0-27.9, adult: Secondary | ICD-10-CM | POA: Diagnosis not present

## 2021-10-23 DIAGNOSIS — M3500 Sicca syndrome, unspecified: Secondary | ICD-10-CM | POA: Diagnosis not present

## 2021-12-09 DIAGNOSIS — M0609 Rheumatoid arthritis without rheumatoid factor, multiple sites: Secondary | ICD-10-CM | POA: Diagnosis not present

## 2022-01-21 DIAGNOSIS — M3500 Sicca syndrome, unspecified: Secondary | ICD-10-CM | POA: Diagnosis not present

## 2022-01-21 DIAGNOSIS — Z79899 Other long term (current) drug therapy: Secondary | ICD-10-CM | POA: Diagnosis not present

## 2022-01-21 DIAGNOSIS — M542 Cervicalgia: Secondary | ICD-10-CM | POA: Diagnosis not present

## 2022-01-21 DIAGNOSIS — M1991 Primary osteoarthritis, unspecified site: Secondary | ICD-10-CM | POA: Diagnosis not present

## 2022-01-21 DIAGNOSIS — Z6826 Body mass index (BMI) 26.0-26.9, adult: Secondary | ICD-10-CM | POA: Diagnosis not present

## 2022-01-21 DIAGNOSIS — E663 Overweight: Secondary | ICD-10-CM | POA: Diagnosis not present

## 2022-01-21 DIAGNOSIS — M797 Fibromyalgia: Secondary | ICD-10-CM | POA: Diagnosis not present

## 2022-01-21 DIAGNOSIS — M0609 Rheumatoid arthritis without rheumatoid factor, multiple sites: Secondary | ICD-10-CM | POA: Diagnosis not present

## 2022-02-03 DIAGNOSIS — M0609 Rheumatoid arthritis without rheumatoid factor, multiple sites: Secondary | ICD-10-CM | POA: Diagnosis not present

## 2022-02-03 DIAGNOSIS — Z79899 Other long term (current) drug therapy: Secondary | ICD-10-CM | POA: Diagnosis not present

## 2022-02-15 DIAGNOSIS — Z23 Encounter for immunization: Secondary | ICD-10-CM | POA: Diagnosis not present

## 2022-03-17 DIAGNOSIS — M797 Fibromyalgia: Secondary | ICD-10-CM | POA: Diagnosis not present

## 2022-03-17 DIAGNOSIS — I10 Essential (primary) hypertension: Secondary | ICD-10-CM | POA: Diagnosis not present

## 2022-03-17 DIAGNOSIS — I493 Ventricular premature depolarization: Secondary | ICD-10-CM | POA: Diagnosis not present

## 2022-03-17 DIAGNOSIS — I2510 Atherosclerotic heart disease of native coronary artery without angina pectoris: Secondary | ICD-10-CM | POA: Diagnosis not present

## 2022-03-17 DIAGNOSIS — R82998 Other abnormal findings in urine: Secondary | ICD-10-CM | POA: Diagnosis not present

## 2022-03-17 DIAGNOSIS — R5383 Other fatigue: Secondary | ICD-10-CM | POA: Diagnosis not present

## 2022-03-17 DIAGNOSIS — I119 Hypertensive heart disease without heart failure: Secondary | ICD-10-CM | POA: Diagnosis not present

## 2022-03-17 DIAGNOSIS — E785 Hyperlipidemia, unspecified: Secondary | ICD-10-CM | POA: Diagnosis not present

## 2022-03-17 DIAGNOSIS — R7989 Other specified abnormal findings of blood chemistry: Secondary | ICD-10-CM | POA: Diagnosis not present

## 2022-03-17 DIAGNOSIS — M1990 Unspecified osteoarthritis, unspecified site: Secondary | ICD-10-CM | POA: Diagnosis not present

## 2022-03-17 DIAGNOSIS — Z1212 Encounter for screening for malignant neoplasm of rectum: Secondary | ICD-10-CM | POA: Diagnosis not present

## 2022-03-17 DIAGNOSIS — F900 Attention-deficit hyperactivity disorder, predominantly inattentive type: Secondary | ICD-10-CM | POA: Diagnosis not present

## 2022-03-17 DIAGNOSIS — F419 Anxiety disorder, unspecified: Secondary | ICD-10-CM | POA: Diagnosis not present

## 2022-03-17 DIAGNOSIS — M069 Rheumatoid arthritis, unspecified: Secondary | ICD-10-CM | POA: Diagnosis not present

## 2022-03-17 DIAGNOSIS — D8989 Other specified disorders involving the immune mechanism, not elsewhere classified: Secondary | ICD-10-CM | POA: Diagnosis not present

## 2022-03-17 DIAGNOSIS — Z0000 Encounter for general adult medical examination without abnormal findings: Secondary | ICD-10-CM | POA: Diagnosis not present

## 2022-03-24 DIAGNOSIS — R29890 Loss of height: Secondary | ICD-10-CM | POA: Diagnosis not present

## 2022-03-24 DIAGNOSIS — Z13820 Encounter for screening for osteoporosis: Secondary | ICD-10-CM | POA: Diagnosis not present

## 2022-03-24 DIAGNOSIS — N958 Other specified menopausal and perimenopausal disorders: Secondary | ICD-10-CM | POA: Diagnosis not present

## 2022-03-31 DIAGNOSIS — M0609 Rheumatoid arthritis without rheumatoid factor, multiple sites: Secondary | ICD-10-CM | POA: Diagnosis not present

## 2022-05-27 DIAGNOSIS — M0609 Rheumatoid arthritis without rheumatoid factor, multiple sites: Secondary | ICD-10-CM | POA: Diagnosis not present

## 2022-07-22 DIAGNOSIS — M1991 Primary osteoarthritis, unspecified site: Secondary | ICD-10-CM | POA: Diagnosis not present

## 2022-07-22 DIAGNOSIS — M542 Cervicalgia: Secondary | ICD-10-CM | POA: Diagnosis not present

## 2022-07-22 DIAGNOSIS — M0609 Rheumatoid arthritis without rheumatoid factor, multiple sites: Secondary | ICD-10-CM | POA: Diagnosis not present

## 2022-07-22 DIAGNOSIS — E663 Overweight: Secondary | ICD-10-CM | POA: Diagnosis not present

## 2022-07-22 DIAGNOSIS — Z6826 Body mass index (BMI) 26.0-26.9, adult: Secondary | ICD-10-CM | POA: Diagnosis not present

## 2022-07-22 DIAGNOSIS — M797 Fibromyalgia: Secondary | ICD-10-CM | POA: Diagnosis not present

## 2022-07-22 DIAGNOSIS — Z79899 Other long term (current) drug therapy: Secondary | ICD-10-CM | POA: Diagnosis not present

## 2022-07-22 DIAGNOSIS — M3501 Sicca syndrome with keratoconjunctivitis: Secondary | ICD-10-CM | POA: Diagnosis not present

## 2022-07-23 DIAGNOSIS — Z111 Encounter for screening for respiratory tuberculosis: Secondary | ICD-10-CM | POA: Diagnosis not present

## 2022-07-23 DIAGNOSIS — M0609 Rheumatoid arthritis without rheumatoid factor, multiple sites: Secondary | ICD-10-CM | POA: Diagnosis not present

## 2022-07-23 DIAGNOSIS — Z79899 Other long term (current) drug therapy: Secondary | ICD-10-CM | POA: Diagnosis not present

## 2022-07-23 DIAGNOSIS — R5383 Other fatigue: Secondary | ICD-10-CM | POA: Diagnosis not present

## 2022-07-29 DIAGNOSIS — D2272 Melanocytic nevi of left lower limb, including hip: Secondary | ICD-10-CM | POA: Diagnosis not present

## 2022-07-29 DIAGNOSIS — D485 Neoplasm of uncertain behavior of skin: Secondary | ICD-10-CM | POA: Diagnosis not present

## 2022-07-29 DIAGNOSIS — D4819 Other specified neoplasm of uncertain behavior of connective and other soft tissue: Secondary | ICD-10-CM | POA: Diagnosis not present

## 2022-07-30 DIAGNOSIS — Z79899 Other long term (current) drug therapy: Secondary | ICD-10-CM | POA: Diagnosis not present

## 2022-07-30 DIAGNOSIS — H2513 Age-related nuclear cataract, bilateral: Secondary | ICD-10-CM | POA: Diagnosis not present

## 2022-07-30 DIAGNOSIS — D3131 Benign neoplasm of right choroid: Secondary | ICD-10-CM | POA: Diagnosis not present

## 2022-07-30 DIAGNOSIS — M3501 Sicca syndrome with keratoconjunctivitis: Secondary | ICD-10-CM | POA: Diagnosis not present

## 2022-08-13 DIAGNOSIS — D485 Neoplasm of uncertain behavior of skin: Secondary | ICD-10-CM | POA: Diagnosis not present

## 2022-08-13 DIAGNOSIS — L988 Other specified disorders of the skin and subcutaneous tissue: Secondary | ICD-10-CM | POA: Diagnosis not present

## 2022-09-17 DIAGNOSIS — M0609 Rheumatoid arthritis without rheumatoid factor, multiple sites: Secondary | ICD-10-CM | POA: Diagnosis not present

## 2022-11-12 DIAGNOSIS — Z79899 Other long term (current) drug therapy: Secondary | ICD-10-CM | POA: Diagnosis not present

## 2022-11-12 DIAGNOSIS — M0609 Rheumatoid arthritis without rheumatoid factor, multiple sites: Secondary | ICD-10-CM | POA: Diagnosis not present

## 2023-01-07 DIAGNOSIS — M0609 Rheumatoid arthritis without rheumatoid factor, multiple sites: Secondary | ICD-10-CM | POA: Diagnosis not present

## 2023-01-20 DIAGNOSIS — M797 Fibromyalgia: Secondary | ICD-10-CM | POA: Diagnosis not present

## 2023-01-20 DIAGNOSIS — M0609 Rheumatoid arthritis without rheumatoid factor, multiple sites: Secondary | ICD-10-CM | POA: Diagnosis not present

## 2023-01-20 DIAGNOSIS — M542 Cervicalgia: Secondary | ICD-10-CM | POA: Diagnosis not present

## 2023-01-20 DIAGNOSIS — Z79899 Other long term (current) drug therapy: Secondary | ICD-10-CM | POA: Diagnosis not present

## 2023-01-20 DIAGNOSIS — R519 Headache, unspecified: Secondary | ICD-10-CM | POA: Diagnosis not present

## 2023-01-20 DIAGNOSIS — M1991 Primary osteoarthritis, unspecified site: Secondary | ICD-10-CM | POA: Diagnosis not present

## 2023-01-20 DIAGNOSIS — E663 Overweight: Secondary | ICD-10-CM | POA: Diagnosis not present

## 2023-01-20 DIAGNOSIS — Z6826 Body mass index (BMI) 26.0-26.9, adult: Secondary | ICD-10-CM | POA: Diagnosis not present

## 2023-01-20 DIAGNOSIS — M3501 Sicca syndrome with keratoconjunctivitis: Secondary | ICD-10-CM | POA: Diagnosis not present

## 2023-01-27 ENCOUNTER — Other Ambulatory Visit: Payer: Self-pay | Admitting: Rheumatology

## 2023-01-27 DIAGNOSIS — M542 Cervicalgia: Secondary | ICD-10-CM

## 2023-01-27 DIAGNOSIS — R519 Headache, unspecified: Secondary | ICD-10-CM

## 2023-01-27 DIAGNOSIS — M0609 Rheumatoid arthritis without rheumatoid factor, multiple sites: Secondary | ICD-10-CM

## 2023-02-02 DIAGNOSIS — Z23 Encounter for immunization: Secondary | ICD-10-CM | POA: Diagnosis not present

## 2023-02-13 ENCOUNTER — Ambulatory Visit
Admission: RE | Admit: 2023-02-13 | Discharge: 2023-02-13 | Disposition: A | Discharge status: Home / Self Care | Payer: Medicare Other | Source: Ambulatory Visit | Attending: Rheumatology | Admitting: Rheumatology

## 2023-02-13 DIAGNOSIS — R519 Headache, unspecified: Secondary | ICD-10-CM

## 2023-02-13 DIAGNOSIS — M47812 Spondylosis without myelopathy or radiculopathy, cervical region: Secondary | ICD-10-CM | POA: Diagnosis not present

## 2023-02-13 DIAGNOSIS — M4312 Spondylolisthesis, cervical region: Secondary | ICD-10-CM | POA: Diagnosis not present

## 2023-02-13 DIAGNOSIS — M0609 Rheumatoid arthritis without rheumatoid factor, multiple sites: Secondary | ICD-10-CM

## 2023-02-13 DIAGNOSIS — M542 Cervicalgia: Secondary | ICD-10-CM

## 2023-03-09 DIAGNOSIS — M0609 Rheumatoid arthritis without rheumatoid factor, multiple sites: Secondary | ICD-10-CM | POA: Diagnosis not present

## 2023-03-13 DIAGNOSIS — Z1389 Encounter for screening for other disorder: Secondary | ICD-10-CM | POA: Diagnosis not present

## 2023-03-13 DIAGNOSIS — E785 Hyperlipidemia, unspecified: Secondary | ICD-10-CM | POA: Diagnosis not present

## 2023-03-13 DIAGNOSIS — I10 Essential (primary) hypertension: Secondary | ICD-10-CM | POA: Diagnosis not present

## 2023-03-19 DIAGNOSIS — I10 Essential (primary) hypertension: Secondary | ICD-10-CM | POA: Diagnosis not present

## 2023-03-19 DIAGNOSIS — E785 Hyperlipidemia, unspecified: Secondary | ICD-10-CM | POA: Diagnosis not present

## 2023-03-19 DIAGNOSIS — Z1389 Encounter for screening for other disorder: Secondary | ICD-10-CM | POA: Diagnosis not present

## 2023-03-20 DIAGNOSIS — M069 Rheumatoid arthritis, unspecified: Secondary | ICD-10-CM | POA: Diagnosis not present

## 2023-03-20 DIAGNOSIS — D8989 Other specified disorders involving the immune mechanism, not elsewhere classified: Secondary | ICD-10-CM | POA: Diagnosis not present

## 2023-03-20 DIAGNOSIS — I2510 Atherosclerotic heart disease of native coronary artery without angina pectoris: Secondary | ICD-10-CM | POA: Diagnosis not present

## 2023-03-20 DIAGNOSIS — F419 Anxiety disorder, unspecified: Secondary | ICD-10-CM | POA: Diagnosis not present

## 2023-03-20 DIAGNOSIS — M797 Fibromyalgia: Secondary | ICD-10-CM | POA: Diagnosis not present

## 2023-03-20 DIAGNOSIS — E785 Hyperlipidemia, unspecified: Secondary | ICD-10-CM | POA: Diagnosis not present

## 2023-03-20 DIAGNOSIS — Z0000 Encounter for general adult medical examination without abnormal findings: Secondary | ICD-10-CM | POA: Diagnosis not present

## 2023-03-20 DIAGNOSIS — Z1331 Encounter for screening for depression: Secondary | ICD-10-CM | POA: Diagnosis not present

## 2023-03-20 DIAGNOSIS — R5383 Other fatigue: Secondary | ICD-10-CM | POA: Diagnosis not present

## 2023-03-20 DIAGNOSIS — I119 Hypertensive heart disease without heart failure: Secondary | ICD-10-CM | POA: Diagnosis not present

## 2023-03-20 DIAGNOSIS — M3500 Sicca syndrome, unspecified: Secondary | ICD-10-CM | POA: Diagnosis not present

## 2023-03-20 DIAGNOSIS — R82998 Other abnormal findings in urine: Secondary | ICD-10-CM | POA: Diagnosis not present

## 2023-03-20 DIAGNOSIS — Z1339 Encounter for screening examination for other mental health and behavioral disorders: Secondary | ICD-10-CM | POA: Diagnosis not present

## 2023-03-26 DIAGNOSIS — Z1212 Encounter for screening for malignant neoplasm of rectum: Secondary | ICD-10-CM | POA: Diagnosis not present

## 2023-04-01 ENCOUNTER — Encounter: Payer: Self-pay | Admitting: Orthopaedic Surgery

## 2023-04-22 DIAGNOSIS — Z1231 Encounter for screening mammogram for malignant neoplasm of breast: Secondary | ICD-10-CM | POA: Diagnosis not present

## 2023-04-22 DIAGNOSIS — N959 Unspecified menopausal and perimenopausal disorder: Secondary | ICD-10-CM | POA: Diagnosis not present

## 2023-04-22 DIAGNOSIS — Z6826 Body mass index (BMI) 26.0-26.9, adult: Secondary | ICD-10-CM | POA: Diagnosis not present

## 2023-04-22 DIAGNOSIS — Z01419 Encounter for gynecological examination (general) (routine) without abnormal findings: Secondary | ICD-10-CM | POA: Diagnosis not present

## 2023-05-04 DIAGNOSIS — M0609 Rheumatoid arthritis without rheumatoid factor, multiple sites: Secondary | ICD-10-CM | POA: Diagnosis not present

## 2023-07-07 DIAGNOSIS — Z79899 Other long term (current) drug therapy: Secondary | ICD-10-CM | POA: Diagnosis not present

## 2023-07-07 DIAGNOSIS — M0609 Rheumatoid arthritis without rheumatoid factor, multiple sites: Secondary | ICD-10-CM | POA: Diagnosis not present

## 2023-07-07 DIAGNOSIS — R5383 Other fatigue: Secondary | ICD-10-CM | POA: Diagnosis not present

## 2023-07-21 DIAGNOSIS — E663 Overweight: Secondary | ICD-10-CM | POA: Diagnosis not present

## 2023-07-21 DIAGNOSIS — M0609 Rheumatoid arthritis without rheumatoid factor, multiple sites: Secondary | ICD-10-CM | POA: Diagnosis not present

## 2023-07-21 DIAGNOSIS — M1991 Primary osteoarthritis, unspecified site: Secondary | ICD-10-CM | POA: Diagnosis not present

## 2023-07-21 DIAGNOSIS — Z79899 Other long term (current) drug therapy: Secondary | ICD-10-CM | POA: Diagnosis not present

## 2023-07-21 DIAGNOSIS — M3501 Sicca syndrome with keratoconjunctivitis: Secondary | ICD-10-CM | POA: Diagnosis not present

## 2023-07-21 DIAGNOSIS — M797 Fibromyalgia: Secondary | ICD-10-CM | POA: Diagnosis not present

## 2023-07-21 DIAGNOSIS — Z6825 Body mass index (BMI) 25.0-25.9, adult: Secondary | ICD-10-CM | POA: Diagnosis not present

## 2023-08-06 DIAGNOSIS — H2513 Age-related nuclear cataract, bilateral: Secondary | ICD-10-CM | POA: Diagnosis not present

## 2023-08-06 DIAGNOSIS — Z79899 Other long term (current) drug therapy: Secondary | ICD-10-CM | POA: Diagnosis not present

## 2023-08-06 DIAGNOSIS — D3131 Benign neoplasm of right choroid: Secondary | ICD-10-CM | POA: Diagnosis not present

## 2023-08-06 DIAGNOSIS — M3501 Sicca syndrome with keratoconjunctivitis: Secondary | ICD-10-CM | POA: Diagnosis not present

## 2023-08-16 IMAGING — CT CT ABD-PELV W/O CM
1 of 2 series · 14 of 32 positions shown, 19 images · non-contrast
Comparison: None

CLINICAL DATA: White specks in urine and odor for few months,
pulling feeling when straining, fatigue, incontinence, recent UTI,
has finished antibiotics

EXAM:
CT ABDOMEN AND PELVIS WITHOUT CONTRAST
TECHNIQUE: Multidetector CT imaging of the abdomen and pelvis was performed
following the standard protocol without IV contrast.

[Series 2: abd/pelvis w/(date) · axial · 0.80mm/px · z∈[-719,-334]mm · 14 of 89 slices shown, 19 images]
[im 6/89  soft-tissue]
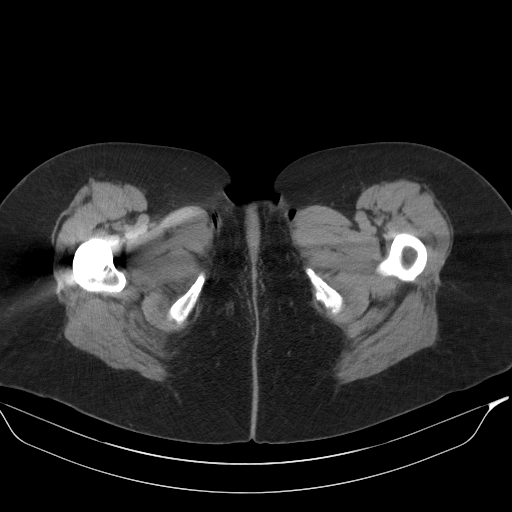
[im 6/89  bone]
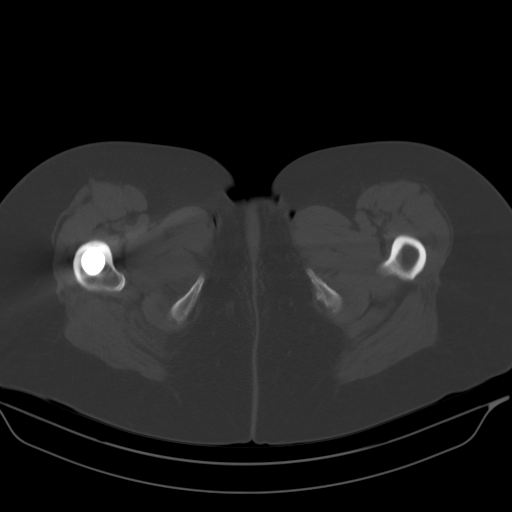
[im 12/89  soft-tissue]
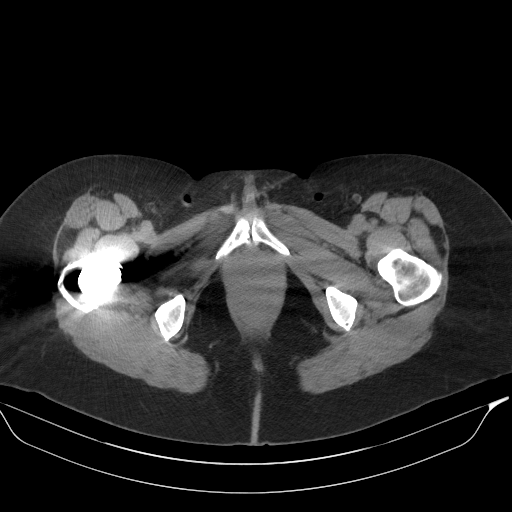
[im 17/89  soft-tissue]
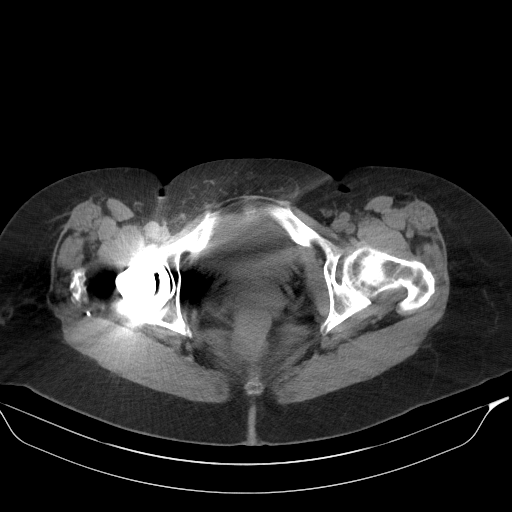
[im 28/89  soft-tissue]
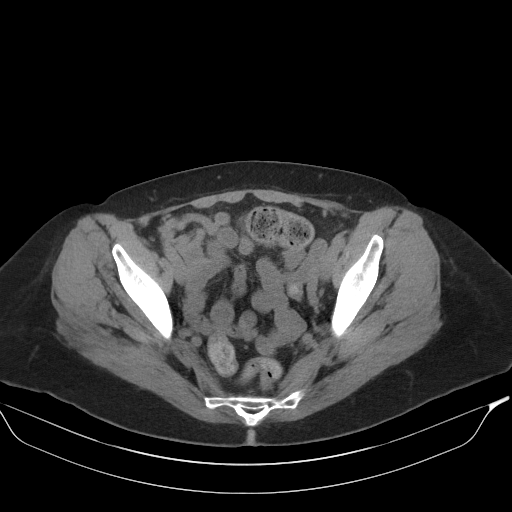
[im 34/89  soft-tissue]
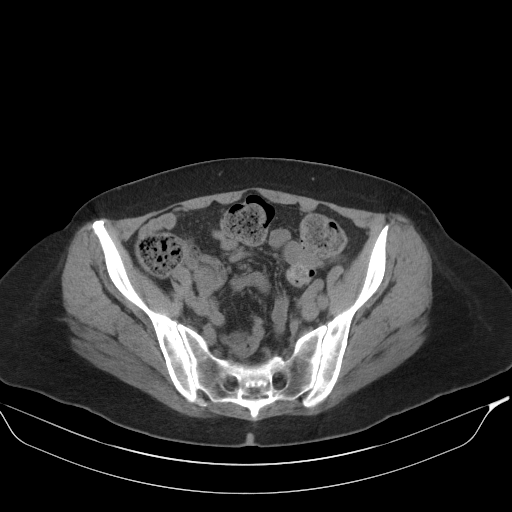
[im 39/89  soft-tissue]
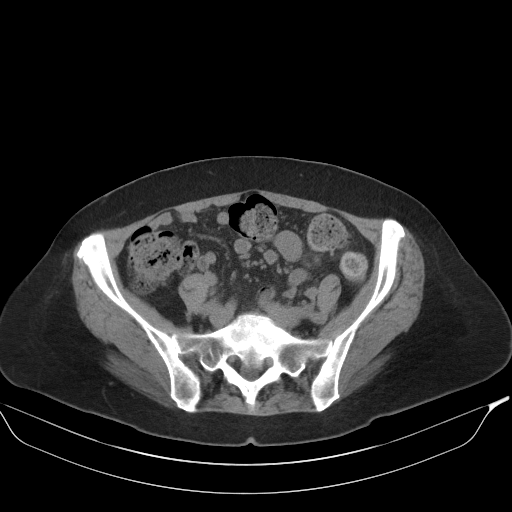
[im 45/89  soft-tissue]
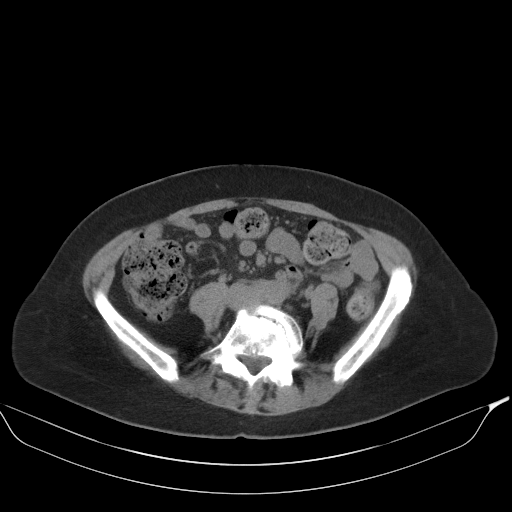
[im 50/89  soft-tissue]
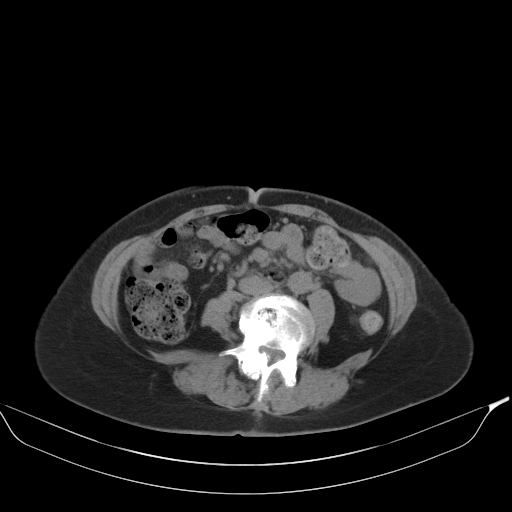
[im 56/89  soft-tissue]
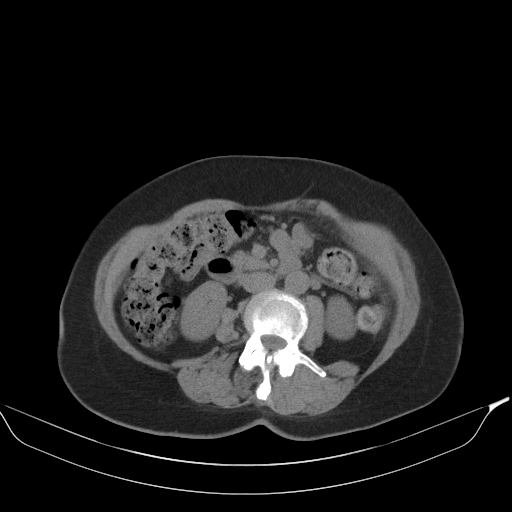
[im 56/89  bone]
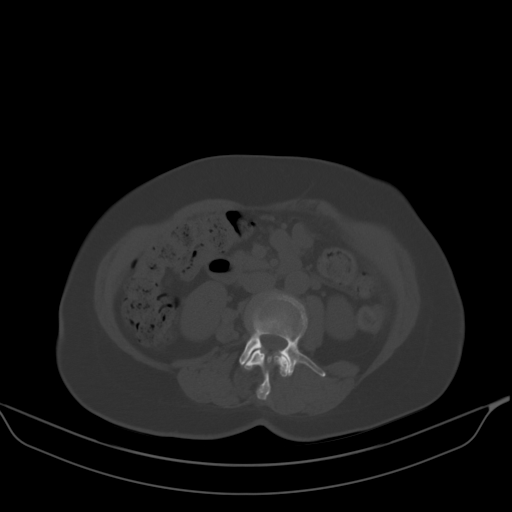
[im 61/89  soft-tissue]
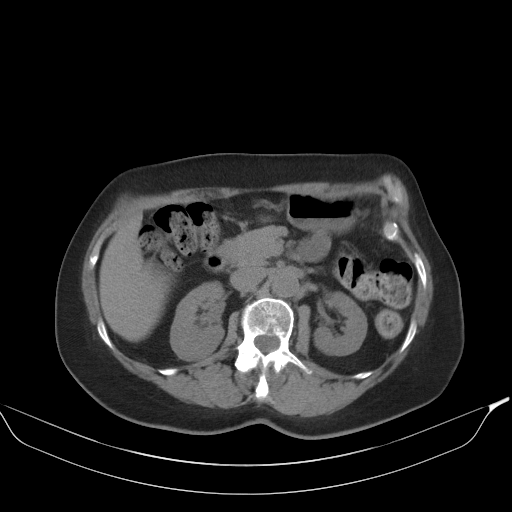
[im 67/89  lung]
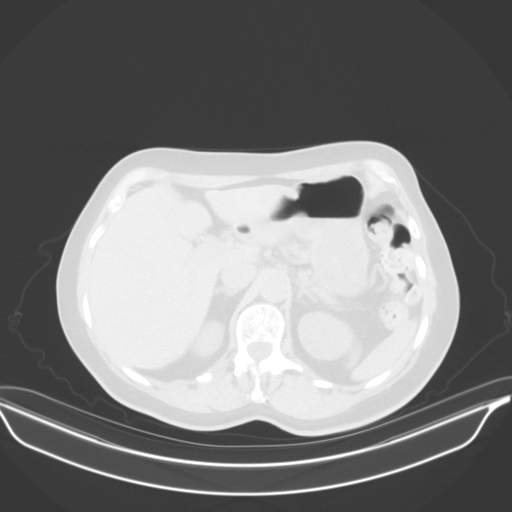
[im 72/89  soft-tissue]
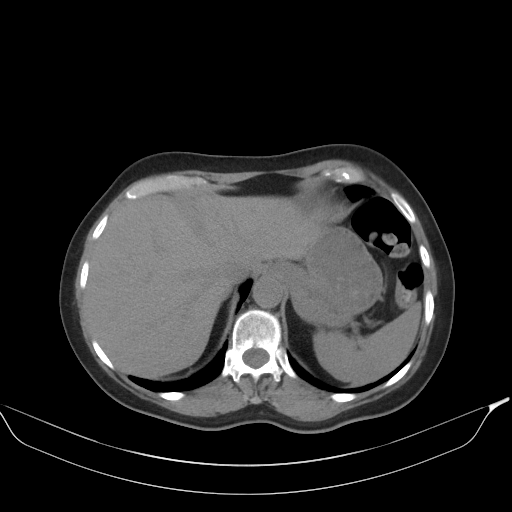
[im 72/89  lung]
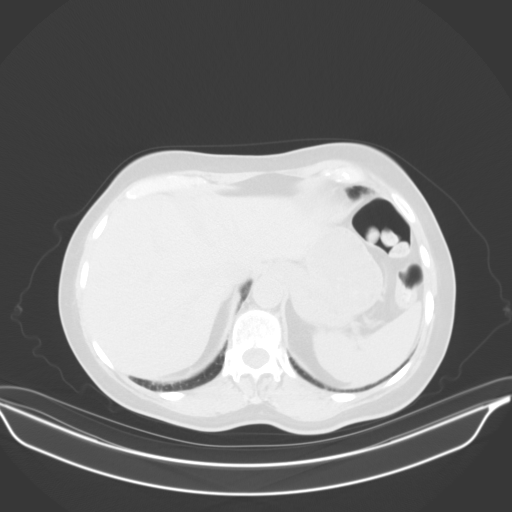
[im 78/89  soft-tissue]
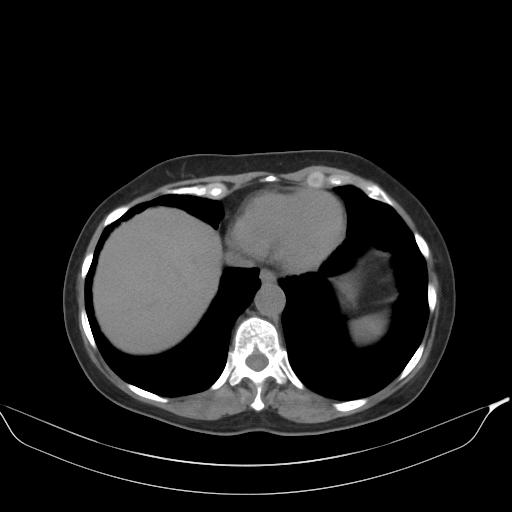
[im 78/89  lung]
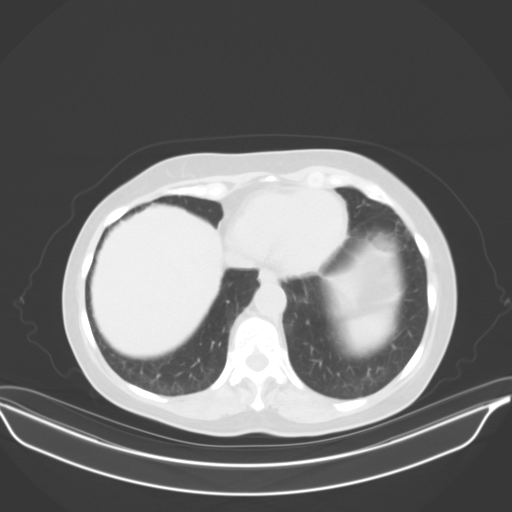
[im 83/89  soft-tissue]
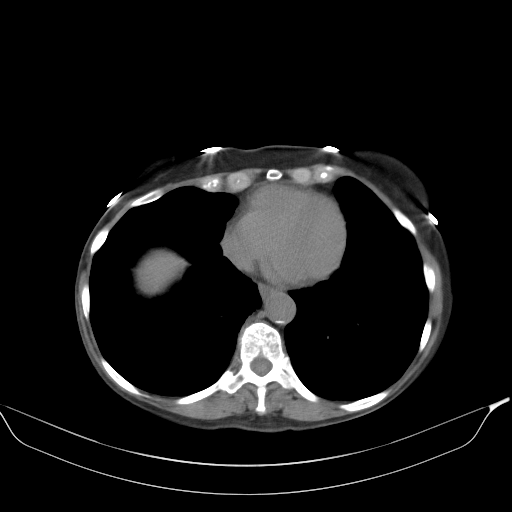
[im 83/89  lung]
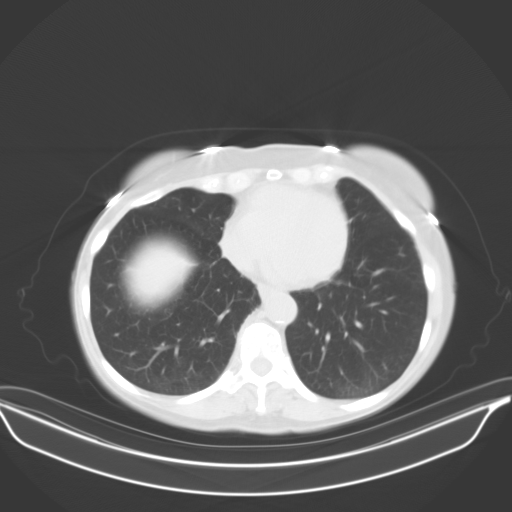

[14 of 32 positions shown; findings below may reference images not displayed]

FINDINGS: Lower chest: Lung bases clear

Hepatobiliary: Gallbladder and liver normal appearance

Pancreas: Normal appearance

Spleen: Normal appearance

Adrenals/Urinary Tract: Adrenal glands normal appearance. Kidneys
normal appearance without mass, hydronephrosis, or calculus. Ureters
and bladder unremarkable.

Stomach/Bowel: Stool throughout colon. Appendix surgically absent by
history. No pericecal inflammatory process. Stomach and bowel loops
otherwise unremarkable for technique.

Vascular/Lymphatic: Aorta normal caliber. No adenopathy. Few pelvic
phleboliths.

Reproductive: Unremarkable uterus and adnexa

Other: No free air or free fluid.  No hernia.

Musculoskeletal: Osseous demineralization. RIGHT hip prosthesis.
Advanced degenerative disc disease changes L3-L4 through L5-S1 with
grade 1 anterolisthesis at L3-L4 and L4-L5. Associated significant
spinal stenosis of L3-L4 and L4-L5.
IMPRESSION: No acute intra-abdominal or intrapelvic abnormalities.

Advanced degenerative disc disease changes of the lumbar spine with
grade 1 anterolisthesis at L3-L4 and L4-L5 and associated
significant spinal stenosis at both levels.

## 2023-09-01 DIAGNOSIS — M0609 Rheumatoid arthritis without rheumatoid factor, multiple sites: Secondary | ICD-10-CM | POA: Diagnosis not present

## 2023-10-27 DIAGNOSIS — M0609 Rheumatoid arthritis without rheumatoid factor, multiple sites: Secondary | ICD-10-CM | POA: Diagnosis not present

## 2023-10-27 DIAGNOSIS — R5383 Other fatigue: Secondary | ICD-10-CM | POA: Diagnosis not present

## 2023-10-27 DIAGNOSIS — Z79899 Other long term (current) drug therapy: Secondary | ICD-10-CM | POA: Diagnosis not present

## 2023-12-11 DIAGNOSIS — Z23 Encounter for immunization: Secondary | ICD-10-CM | POA: Diagnosis not present

## 2023-12-22 DIAGNOSIS — Z79899 Other long term (current) drug therapy: Secondary | ICD-10-CM | POA: Diagnosis not present

## 2023-12-22 DIAGNOSIS — M0609 Rheumatoid arthritis without rheumatoid factor, multiple sites: Secondary | ICD-10-CM | POA: Diagnosis not present

## 2024-01-21 DIAGNOSIS — Z79899 Other long term (current) drug therapy: Secondary | ICD-10-CM | POA: Diagnosis not present

## 2024-01-21 DIAGNOSIS — M1991 Primary osteoarthritis, unspecified site: Secondary | ICD-10-CM | POA: Diagnosis not present

## 2024-01-21 DIAGNOSIS — M3501 Sicca syndrome with keratoconjunctivitis: Secondary | ICD-10-CM | POA: Diagnosis not present

## 2024-01-21 DIAGNOSIS — M0609 Rheumatoid arthritis without rheumatoid factor, multiple sites: Secondary | ICD-10-CM | POA: Diagnosis not present

## 2024-01-21 DIAGNOSIS — Z6824 Body mass index (BMI) 24.0-24.9, adult: Secondary | ICD-10-CM | POA: Diagnosis not present

## 2024-01-21 DIAGNOSIS — M797 Fibromyalgia: Secondary | ICD-10-CM | POA: Diagnosis not present

## 2024-02-16 DIAGNOSIS — M0609 Rheumatoid arthritis without rheumatoid factor, multiple sites: Secondary | ICD-10-CM | POA: Diagnosis not present

## 2024-02-23 DIAGNOSIS — Z23 Encounter for immunization: Secondary | ICD-10-CM | POA: Diagnosis not present

## 2024-03-17 DIAGNOSIS — Z79899 Other long term (current) drug therapy: Secondary | ICD-10-CM | POA: Diagnosis not present

## 2024-03-17 DIAGNOSIS — E785 Hyperlipidemia, unspecified: Secondary | ICD-10-CM | POA: Diagnosis not present

## 2024-03-17 DIAGNOSIS — I10 Essential (primary) hypertension: Secondary | ICD-10-CM | POA: Diagnosis not present

## 2024-03-17 DIAGNOSIS — Z1212 Encounter for screening for malignant neoplasm of rectum: Secondary | ICD-10-CM | POA: Diagnosis not present

## 2024-03-24 DIAGNOSIS — I10 Essential (primary) hypertension: Secondary | ICD-10-CM | POA: Diagnosis not present

## 2024-03-24 DIAGNOSIS — R7989 Other specified abnormal findings of blood chemistry: Secondary | ICD-10-CM | POA: Diagnosis not present

## 2024-03-24 DIAGNOSIS — Z1331 Encounter for screening for depression: Secondary | ICD-10-CM | POA: Diagnosis not present

## 2024-03-24 DIAGNOSIS — Z1212 Encounter for screening for malignant neoplasm of rectum: Secondary | ICD-10-CM | POA: Diagnosis not present

## 2024-03-24 DIAGNOSIS — M069 Rheumatoid arthritis, unspecified: Secondary | ICD-10-CM | POA: Diagnosis not present

## 2024-03-24 DIAGNOSIS — M797 Fibromyalgia: Secondary | ICD-10-CM | POA: Diagnosis not present

## 2024-03-24 DIAGNOSIS — D8989 Other specified disorders involving the immune mechanism, not elsewhere classified: Secondary | ICD-10-CM | POA: Diagnosis not present

## 2024-03-24 DIAGNOSIS — M1990 Unspecified osteoarthritis, unspecified site: Secondary | ICD-10-CM | POA: Diagnosis not present

## 2024-03-24 DIAGNOSIS — I119 Hypertensive heart disease without heart failure: Secondary | ICD-10-CM | POA: Diagnosis not present

## 2024-03-24 DIAGNOSIS — E785 Hyperlipidemia, unspecified: Secondary | ICD-10-CM | POA: Diagnosis not present

## 2024-03-24 DIAGNOSIS — R82998 Other abnormal findings in urine: Secondary | ICD-10-CM | POA: Diagnosis not present

## 2024-03-24 DIAGNOSIS — Z0000 Encounter for general adult medical examination without abnormal findings: Secondary | ICD-10-CM | POA: Diagnosis not present

## 2024-03-24 DIAGNOSIS — F900 Attention-deficit hyperactivity disorder, predominantly inattentive type: Secondary | ICD-10-CM | POA: Diagnosis not present

## 2024-03-24 DIAGNOSIS — R5383 Other fatigue: Secondary | ICD-10-CM | POA: Diagnosis not present

## 2024-03-24 DIAGNOSIS — F419 Anxiety disorder, unspecified: Secondary | ICD-10-CM | POA: Diagnosis not present

## 2024-03-24 DIAGNOSIS — I2510 Atherosclerotic heart disease of native coronary artery without angina pectoris: Secondary | ICD-10-CM | POA: Diagnosis not present

## 2024-03-24 DIAGNOSIS — Z1389 Encounter for screening for other disorder: Secondary | ICD-10-CM | POA: Diagnosis not present

## 2024-04-12 DIAGNOSIS — M0609 Rheumatoid arthritis without rheumatoid factor, multiple sites: Secondary | ICD-10-CM | POA: Diagnosis not present
